# Patient Record
Sex: Male | Born: 1990 | Race: Black or African American | Hispanic: No | Marital: Single | Smoking: Never smoker
Health system: Southern US, Community
[De-identification: ages and names within clinical notes are randomized; demographics above are authoritative.]

## PROBLEM LIST (undated history)

## (undated) DIAGNOSIS — Q431 Hirschsprung's disease: Secondary | ICD-10-CM

---
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
## 2017-03-19 ENCOUNTER — Encounter: Payer: Self-pay | Admitting: *Deleted

## 2017-03-19 DIAGNOSIS — R1084 Generalized abdominal pain: Secondary | ICD-10-CM | POA: Insufficient documentation

## 2017-03-19 DIAGNOSIS — A749 Chlamydial infection, unspecified: Secondary | ICD-10-CM | POA: Insufficient documentation

## 2017-03-19 DIAGNOSIS — N390 Urinary tract infection, site not specified: Secondary | ICD-10-CM | POA: Insufficient documentation

## 2017-03-19 LAB — URINALYSIS, COMPLETE (UACMP) WITH MICROSCOPIC
BACTERIA UA: NONE SEEN
BILIRUBIN URINE: NEGATIVE
Glucose, UA: NEGATIVE mg/dL
HGB URINE DIPSTICK: NEGATIVE
Ketones, ur: NEGATIVE mg/dL
NITRITE: NEGATIVE
PROTEIN: 30 mg/dL — AB
Specific Gravity, Urine: 1.031 — ABNORMAL HIGH (ref 1.005–1.030)
pH: 5 (ref 5.0–8.0)

## 2017-03-19 LAB — CBC
HEMATOCRIT: 49.1 % (ref 40.0–52.0)
HEMOGLOBIN: 16.6 g/dL (ref 13.0–18.0)
MCH: 29.8 pg (ref 26.0–34.0)
MCHC: 33.9 g/dL (ref 32.0–36.0)
MCV: 87.8 fL (ref 80.0–100.0)
Platelets: 210 10*3/uL (ref 150–440)
RBC: 5.59 MIL/uL (ref 4.40–5.90)
RDW: 13.9 % (ref 11.5–14.5)
WBC: 5.3 10*3/uL (ref 3.8–10.6)

## 2017-03-19 LAB — COMPREHENSIVE METABOLIC PANEL
ALBUMIN: 4 g/dL (ref 3.5–5.0)
ALT: 25 U/L (ref 17–63)
ANION GAP: 9 (ref 5–15)
AST: 23 U/L (ref 15–41)
Alkaline Phosphatase: 53 U/L (ref 38–126)
BILIRUBIN TOTAL: 0.7 mg/dL (ref 0.3–1.2)
BUN: 14 mg/dL (ref 6–20)
CHLORIDE: 102 mmol/L (ref 101–111)
CO2: 28 mmol/L (ref 22–32)
Calcium: 9.2 mg/dL (ref 8.9–10.3)
Creatinine, Ser: 1.18 mg/dL (ref 0.61–1.24)
GFR calc Af Amer: >60 mL/min (ref >60)
GFR calc non Af Amer: >60 mL/min (ref >60)
GLUCOSE: 92 mg/dL (ref 65–99)
POTASSIUM: 3.4 mmol/L — AB (ref 3.5–5.1)
Sodium: 139 mmol/L (ref 135–145)
TOTAL PROTEIN: 7.6 g/dL (ref 6.5–8.1)

## 2017-03-19 LAB — LIPASE, BLOOD: LIPASE: 29 U/L (ref 11–51)

## 2017-03-19 LAB — TROPONIN I

## 2017-03-19 MED ORDER — OXYCODONE-ACETAMINOPHEN 5-325 MG PO TABS
1.0000 | ORAL_TABLET | Freq: Once | ORAL | Status: AC
  Administered 2017-03-19: 1 via ORAL
  Filled 2017-03-19: qty 1

## 2017-03-19 NOTE — ED Triage Notes (Signed)
Pt c/o generalized abdominal pain since Wednesday. Pt c/o decreased appetite and decreased PO intake. Pt has ileostomy that he says is working appropriately but has decreased output. Pt c/o nausea, denies vomiting. Pt takes no meds except a monthly B-12 shot.

## 2017-03-20 ENCOUNTER — Emergency Department: Payer: Medicaid Other

## 2017-03-20 ENCOUNTER — Emergency Department
Admission: EM | Admit: 2017-03-20 | Discharge: 2017-03-20 | Disposition: A | Discharge status: Home / Self Care | Payer: Medicaid Other | Attending: Emergency Medicine | Admitting: Emergency Medicine

## 2017-03-20 DIAGNOSIS — A64 Unspecified sexually transmitted disease: Secondary | ICD-10-CM

## 2017-03-20 DIAGNOSIS — N390 Urinary tract infection, site not specified: Secondary | ICD-10-CM

## 2017-03-20 DIAGNOSIS — R1084 Generalized abdominal pain: Secondary | ICD-10-CM

## 2017-03-20 DIAGNOSIS — A749 Chlamydial infection, unspecified: Secondary | ICD-10-CM

## 2017-03-20 HISTORY — DX: Hirschsprung's disease: Q43.1

## 2017-03-20 LAB — CHLAMYDIA/NGC RT PCR (ARMC ONLY)
CHLAMYDIA TR: DETECTED — AB
N GONORRHOEAE: NOT DETECTED

## 2017-03-20 MED ORDER — IOPAMIDOL (ISOVUE-300) INJECTION 61%
100.0000 mL | Freq: Once | INTRAVENOUS | Status: AC | PRN
  Administered 2017-03-20: 100 mL via INTRAVENOUS

## 2017-03-20 MED ORDER — MORPHINE SULFATE (PF) 2 MG/ML IV SOLN
INTRAVENOUS | Status: AC
  Administered 2017-03-20: 4 mg via INTRAVENOUS
  Filled 2017-03-20: qty 2

## 2017-03-20 MED ORDER — METOCLOPRAMIDE HCL 10 MG PO TABS: 10.0000 mg | ORAL_TABLET | Freq: Four times a day (QID) | ORAL | 0 refills | Status: DC

## 2017-03-20 MED ORDER — AZITHROMYCIN 1 G PO PACK
1.0000 g | PACK | Freq: Once | ORAL | Status: AC
  Administered 2017-03-20: 1 g via ORAL
  Filled 2017-03-20: qty 1

## 2017-03-20 MED ORDER — CEFTRIAXONE SODIUM IN DEXTROSE 20 MG/ML IV SOLN
1.0000 g | Freq: Once | INTRAVENOUS | Status: AC
Start: 2017-03-20 — End: 2017-03-20
  Administered 2017-03-20: 1 g via INTRAVENOUS
  Filled 2017-03-20: qty 50

## 2017-03-20 MED ORDER — MORPHINE SULFATE (PF) 4 MG/ML IV SOLN: 4.0000 mg | Freq: Once | INTRAVENOUS | Status: DC

## 2017-03-20 MED ORDER — IOPAMIDOL (ISOVUE-300) INJECTION 61%
30.0000 mL | Freq: Once | INTRAVENOUS | Status: AC | PRN
  Administered 2017-03-20: 30 mL via ORAL

## 2017-03-20 MED ORDER — DICYCLOMINE HCL 20 MG PO TABS: 20.0000 mg | ORAL_TABLET | Freq: Four times a day (QID) | ORAL | 0 refills | Status: DC | PRN

## 2017-03-20 MED ORDER — DOXYCYCLINE HYCLATE 50 MG PO CAPS: 100.0000 mg | ORAL_CAPSULE | Freq: Two times a day (BID) | ORAL | 0 refills | Status: DC

## 2017-03-20 MED ORDER — ONDANSETRON HCL 4 MG/2ML IJ SOLN
4.0000 mg | Freq: Once | INTRAMUSCULAR | Status: AC
  Administered 2017-03-20: 4 mg via INTRAVENOUS
  Filled 2017-03-20: qty 2

## 2017-03-20 MED ORDER — SODIUM CHLORIDE 0.9 % IV BOLUS (SEPSIS)
500.0000 mL | Freq: Once | INTRAVENOUS | Status: AC
  Administered 2017-03-20: 500 mL via INTRAVENOUS

## 2017-03-20 NOTE — ED Notes (Signed)
Pt not able to sign e-signature pad at this time. Computer restarted in room.

## 2017-03-20 NOTE — ED Notes (Signed)
IV attempt x1 by this RN and Nadeen Landau unsuccessful. WIll continue to follow up to gain access.

## 2017-03-20 NOTE — Discharge Instructions (Signed)
1. Take antibiotic as prescribed (doxycycline 100 mg twice daily 7 days). 2. You must notify all sexual partners to seek treatment at the health Department. 3. You may take medicines as needed for abdominal pain and nausea (Bentyl/Reglan). 4. Return to the ER for worsening symptoms, persistent vomiting, difficulty breathing or other concerns.

## 2017-03-20 NOTE — ED Notes (Signed)
Pt notified a new urine sample is needed, pt given urinal and placed at bedside. Pt states he does not need to urinate at this time. Pt verbalizes understanding of urine being needed for specific test.

## 2017-03-20 NOTE — ED Provider Notes (Signed)
[facility] Emergency Department Provider Note   ____________________________________________   First MD Initiated Contact with Patient 03/20/17 0143     (approximate)  I have reviewed the triage vital signs and the nursing notes.   HISTORY  Chief Complaint Abdominal Pain    HPI Kerry Ball is a 26 y.o. male brought to the ED from home via EMS with a chief complaint of abdominal pain. 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 Complains of generalized abdominal pain 2 days. Symptoms associated with decreased appetite and decreased output from his ostomy bag. Denies fever, chills, chest pain, shortness of breath, nausea, vomiting. Does note increased urinary frequency. Here visiting from [place].   Past Medical History:  Diagnosis Date  . Hirschsprung disease of rectosigmoid region     There are no active problems to display for this patient.   Past Surgical History:  Procedure Laterality Date  . ILEOSTOMY  1992    Prior to Admission medications   Medication Sig Start Date End Date Taking? Authorizing Provider  cyanocobalamin (,VITAMIN B-12,) 1000 MCG/ML injection Inject 1,000 mcg into the muscle once.   Yes [provider]  dicyclomine (BENTYL) 20 MG tablet Take 1 tablet (20 mg total) by mouth every 6 (six) hours as needed. 03/20/17   Irean Hong, MD  doxycycline (VIBRAMYCIN) 50 MG capsule Take 2 capsules (100 mg total) by mouth 2 (two) times daily. 03/20/17   Irean Hong, MD  metoCLOPramide (REGLAN) 10 MG tablet Take 1 tablet (10 mg total) by mouth 4 (four) times daily. 03/20/17   Irean Hong, MD    Allergies Patient has no known allergies.  History reviewed. No pertinent family history.  Social History Social History  Substance Use Topics  . Smoking status: Never Smoker  . Smokeless tobacco: Never Used  . Alcohol use No    Review of Systems  Constitutional: No  fever/chills. Eyes: No visual changes. ENT: No sore throat. Cardiovascular: Denies chest pain. Respiratory: Denies shortness of breath. Gastrointestinal: positive for abdominal pain.  No nausea, no vomiting.  No diarrhea.  No constipation. Genitourinary: positive for frequency. Negative for dysuria. Musculoskeletal: Negative for back pain. Skin: Negative for rash. Neurological: Negative for headaches, focal weakness or numbness.   ____________________________________________   PHYSICAL EXAM:  VITAL SIGNS: ED Triage Vitals  Enc Vitals Group     BP 03/19/17 2026 120/81     Pulse Rate 03/19/17 2026 65     Resp 03/19/17 2026 (!) 22     Temp 03/19/17 2026 99 F (37.2 C)     Temp Source 03/19/17 2026 Oral     SpO2 03/19/17 2026 98 %     Weight 03/19/17 2027 135 lb (61.2 kg)     Height 03/19/17 2027  (1.676 m)     Head Circumference --      Peak Flow --      Pain Score 03/19/17 2026 8     Pain Loc --      Pain Edu? --      Excl. in GC? --     Constitutional: Alert and oriented. Well appearing and in mild acute distress. Eyes: Conjunctivae are normal. PERRL. EOMI. Head: Atraumatic. Nose: No congestion/rhinnorhea. Mouth/Throat: Mucous membranes are moist.  Oropharynx non-erythematous. Neck: No stridor.   Cardiovascular: Normal rate, regular rhythm. Grossly normal heart sounds.  Good peripheral circulation. Respiratory: Normal respiratory effort.  No retractions. Lungs CTAB. Gastrointestinal: Soft  and mildly tender to palpation diffusely without rebound or guarding. Ileostomy with small amount soft, nonbloody stool. No distention. No abdominal bruits. No CVA tenderness. Musculoskeletal: No lower extremity tenderness nor edema.  No joint effusions. Neurologic:  Normal speech and language. No gross focal neurologic deficits are appreciated.  Skin:  Skin is warm, dry and intact. No rash noted. Psychiatric: Mood and affect are normal. Speech and behavior are  normal.  ____________________________________________   LABS (all labs ordered are listed, but only abnormal results are displayed)  Labs Reviewed  CHLAMYDIA/NGC RT PCR ([facility] ONLY) - Abnormal; Notable for the following:       Result Value   Chlamydia Tr DETECTED (*)    All other components within normal limits  COMPREHENSIVE METABOLIC PANEL - Abnormal; Notable for the following:    Potassium 3.4 (*)    All other components within normal limits  URINALYSIS, COMPLETE (UACMP) WITH MICROSCOPIC - Abnormal; Notable for the following:    Color, Urine YELLOW (*)    APPearance CLEAR (*)    Specific Gravity, Urine 1.031 (*)    Protein, ur 30 (*)    Leukocytes, UA SMALL (*)    Squamous Epithelial / LPF 0-5 (*)    All other components within normal limits  URINE CULTURE  LIPASE, BLOOD  CBC  TROPONIN I   ____________________________________________  EKG  ED ECG REPORT I, Dwan Hemmelgarn J, the attending physician, personally viewed and interpreted this ECG.   Date: 03/20/2017  EKG Time: 2036  Rate: 63  Rhythm: normal EKG, normal sinus rhythm  Axis: normal  Intervals:none  ST&T Change: nonspecific  ____________________________________________  RADIOLOGY  Ct Abdomen Pelvis W Contrast  Result Date: 03/20/2017 CLINICAL DATA:  Generalized abdominal pain. Ileostomy with decreased output. EXAM: CT ABDOMEN AND PELVIS WITH CONTRAST TECHNIQUE: Multidetector CT imaging of the abdomen and pelvis was performed using the standard protocol following bolus administration of intravenous contrast. CONTRAST:  ISOVUE-300 IOPAMIDOL (ISOVUE-300) INJECTION 61% COMPARISON:  None. FINDINGS: Lower chest: The lung bases are clear. Hepatobiliary: No focal liver abnormality is seen. No gallstones, gallbladder wall thickening, or biliary dilatation. Pancreas: No ductal dilatation or inflammation. Spleen: Normal in size without focal abnormality. Adrenals/Urinary Tract: Adrenal glands are  unremarkable. Kidneys are normal, without renal calculi, focal lesion, or hydronephrosis. Bladder is anteriorly located in the pelvis without wall thickening or inflammatory change. Stomach/Bowel: There is enteric contrast in the stomach and possibly duodenum. No contrast within the remainder of small bowel. Mild gastric wall thickening about the greater curvature. Right lower quadrant ileostomy without parastomal hernia. There is fecalization of small bowel contents proximal to the ileostomy without small bowel inflammation. Small bowel is otherwise decompressed. Stapled off descending and sigmoid colon contain high-density contrast which is likely chronic. There is no evidence of bowel wall thickening or inflammatory change. Vascular/Lymphatic: No significant vascular findings are present. No enlarged abdominal or pelvic lymph nodes. Reproductive: Prostate is unremarkable. Other: No free air or ascites. No mesenteric edema. No intra-abdominal abscess. Musculoskeletal: There are no acute or suspicious osseous abnormalities. IMPRESSION: 1. Right lower quadrant ileostomy with fecalization of small bowel contents proximal to the ileostomy, suggesting slow transit. No associated bowel inflammation or evidence of obstruction. 2. Mild gastric wall thickening of the greater curvature, gastritis versus peptic ulcer disease. Electronically Signed   By: Rubye Oaks M.D.   On: 03/20/2017 04:36    ____________________________________________   PROCEDURES  Procedure(s) performed: None  Procedures  Critical Care performed: No  ____________________________________________   INITIAL  IMPRESSION / ASSESSMENT AND PLAN / ED COURSE  Pertinent labs & imaging results that were available during my care of the patient were reviewed by me and considered in my medical decision making (see chart for details).  26 year old male with ileostomy who presents with generalized abdominal pain. Differential diagnosis  includes, but is not limited to, acute appendicitis, renal colic, testicular torsion, urinary tract infection/pyelonephritis, prostatitis,  epididymitis, diverticulitis, small bowel obstruction or ileus, colitis, abdominal aortic aneurysm, gastroenteritis, hernia, etc. laboratory results unremarkable. Urinalysis demonstrates UTI. Patient is sexually active having unprotected intercourse and would like testing for STD. Will proceed with CT abdomen/pelvis to evaluate for intra-abdominal etiology of patient's pain.  Clinical Course as of Mar 20 710  Sat Mar 20, 2017  1610 Patient completed one bottle of oral contrast; states he cannot tolerate anymore. Will send for CT scan.  [JS]  0708 Patient was soundly asleep when I updated him of all testing results including CT scan. He was treated with azithromycin for chlamydia and discharged home on doxycycline. Strict return precautions were given. Patient verbalize understanding and agreed with plan of care.  [JS]    Clinical Course User Index [JS] Irean Hong, MD     ____________________________________________   FINAL CLINICAL IMPRESSION(S) / ED DIAGNOSES  Final diagnoses:  Generalized abdominal pain  Chlamydia  Lower urinary tract infectious disease  STD (male)      NEW MEDICATIONS STARTED DURING THIS VISIT:  New Prescriptions   DICYCLOMINE (BENTYL) 20 MG TABLET    Take 1 tablet (20 mg total) by mouth every 6 (six) hours as needed.   DOXYCYCLINE (VIBRAMYCIN) 50 MG CAPSULE    Take 2 capsules (100 mg total) by mouth 2 (two) times daily.   METOCLOPRAMIDE (REGLAN) 10 MG TABLET    Take 1 tablet (10 mg total) by mouth 4 (four) times daily.     Note:  This document was prepared using Dragon voice recognition software and may include unintentional dictation errors.    Irean Hong, MD 03/20/17 [phone]

## 2017-03-20 NOTE — ED Notes (Signed)
Pt informed that [facility] will be sending ileostomy bags for him to change his current bag. [facility] have been called to verify pt's ability to be DC to same place. Pt waiting on urine results in order to be DC at this time.

## 2017-03-21 ENCOUNTER — Emergency Department: Payer: Medicaid Other

## 2017-03-21 ENCOUNTER — Emergency Department
Admission: EM | Admit: 2017-03-21 | Discharge: 2017-03-22 | Disposition: A | Discharge status: Home / Self Care | Payer: Medicaid Other | Attending: Emergency Medicine | Admitting: Emergency Medicine

## 2017-03-21 DIAGNOSIS — Q431 Hirschsprung's disease: Secondary | ICD-10-CM | POA: Insufficient documentation

## 2017-03-21 DIAGNOSIS — R1031 Right lower quadrant pain: Secondary | ICD-10-CM

## 2017-03-21 DIAGNOSIS — Z79899 Other long term (current) drug therapy: Secondary | ICD-10-CM | POA: Diagnosis not present

## 2017-03-21 DIAGNOSIS — Z932 Ileostomy status: Secondary | ICD-10-CM | POA: Diagnosis not present

## 2017-03-21 DIAGNOSIS — K2970 Gastritis, unspecified, without bleeding: Secondary | ICD-10-CM

## 2017-03-21 DIAGNOSIS — R109 Unspecified abdominal pain: Secondary | ICD-10-CM

## 2017-03-21 LAB — LIPASE, BLOOD: LIPASE: 37 U/L (ref 11–51)

## 2017-03-21 LAB — COMPREHENSIVE METABOLIC PANEL
ALBUMIN: 4.3 g/dL (ref 3.5–5.0)
ALT: 23 U/L (ref 17–63)
AST: 23 U/L (ref 15–41)
Alkaline Phosphatase: 52 U/L (ref 38–126)
Anion gap: 7 (ref 5–15)
BUN: 14 mg/dL (ref 6–20)
CHLORIDE: 106 mmol/L (ref 101–111)
CO2: 28 mmol/L (ref 22–32)
CREATININE: 1.13 mg/dL (ref 0.61–1.24)
Calcium: 9.3 mg/dL (ref 8.9–10.3)
GFR calc Af Amer: >60 mL/min (ref >60)
GFR calc non Af Amer: >60 mL/min (ref >60)
GLUCOSE: 85 mg/dL (ref 65–99)
POTASSIUM: 3.6 mmol/L (ref 3.5–5.1)
Sodium: 141 mmol/L (ref 135–145)
Total Bilirubin: 0.9 mg/dL (ref 0.3–1.2)
Total Protein: 7.8 g/dL (ref 6.5–8.1)

## 2017-03-21 LAB — CBC
HEMATOCRIT: 49.7 % (ref 40.0–52.0)
Hemoglobin: 16.4 g/dL (ref 13.0–18.0)
MCH: 29 pg (ref 26.0–34.0)
MCHC: 33 g/dL (ref 32.0–36.0)
MCV: 87.9 fL (ref 80.0–100.0)
Platelets: 208 10*3/uL (ref 150–440)
RBC: 5.65 MIL/uL (ref 4.40–5.90)
RDW: 13.9 % (ref 11.5–14.5)
WBC: 4.6 10*3/uL (ref 3.8–10.6)

## 2017-03-21 LAB — URINE CULTURE

## 2017-03-21 MED ORDER — SODIUM CHLORIDE 0.9 % IV BOLUS (SEPSIS)
1000.0000 mL | Freq: Once | INTRAVENOUS | Status: AC
  Administered 2017-03-21: 1000 mL via INTRAVENOUS

## 2017-03-21 MED ORDER — IOPAMIDOL (ISOVUE-300) INJECTION 61%
30.0000 mL | Freq: Once | INTRAVENOUS | Status: AC
  Administered 2017-03-21: 30 mL via ORAL
  Filled 2017-03-21: qty 30

## 2017-03-21 MED ORDER — MORPHINE SULFATE (PF) 4 MG/ML IV SOLN
4.0000 mg | Freq: Once | INTRAVENOUS | Status: AC
  Administered 2017-03-21: 4 mg via INTRAVENOUS
  Filled 2017-03-21: qty 1

## 2017-03-21 MED ORDER — ONDANSETRON HCL 4 MG/2ML IJ SOLN
4.0000 mg | Freq: Once | INTRAMUSCULAR | Status: AC
  Administered 2017-03-21: 4 mg via INTRAVENOUS
  Filled 2017-03-21: qty 2

## 2017-03-21 NOTE — ED Notes (Signed)
Lab at bedside

## 2017-03-21 NOTE — ED Notes (Signed)
This EDT attempted to stick pt and pt jerked back and st that he had a sharp stinging sensation radiate thru his entire arm. The needle did not fully advance into pts arm. Pt st he believes I hit a nerve. This EDT used a 23gauge. Pt st "they had to use Ultrasound the other day to get my vein". This EDT informed the other available RN's in triage and this EDT called lab to attempt to obtain blood

## 2017-03-21 NOTE — ED Provider Notes (Signed)
[facility] Emergency Department Provider Note  ____________________________________________   First MD Initiated Contact with Patient 03/21/17 2042     (approximate)  I have reviewed the triage vital signs and the nursing notes.   HISTORY  Chief Complaint Abdominal Pain   HPI Kerry Ball is a 26 y.o. male with a history of an ileostomy from Hirschsprung's disease as an infant who is presenting to the emergency department today with worsening pain to his right lower quadrant. He says the pain isn't worsening since being discharged from the emergency department yesterday morning. He says that he is also in need of replacement ostomy bags. He denies any nausea or vomiting. Reports brown stool production from ostomy. He says the pain is an 8-9 or 10 at this time and feels like a tightness and cramping. No radiation to the back.   Past Medical History:  Diagnosis Date  . Hirschsprung disease of rectosigmoid region     There are no active problems to display for this patient.   Past Surgical History:  Procedure Laterality Date  . ILEOSTOMY  1992    Prior to Admission medications   Medication Sig Start Date End Date Taking? Authorizing Provider  cyanocobalamin (,VITAMIN B-12,) 1000 MCG/ML injection Inject 1,000 mcg into the muscle once.    [provider]  dicyclomine (BENTYL) 20 MG tablet Take 1 tablet (20 mg total) by mouth every 6 (six) hours as needed. 03/20/17   Irean Hong, MD  doxycycline (VIBRAMYCIN) 50 MG capsule Take 2 capsules (100 mg total) by mouth 2 (two) times daily. 03/20/17   Irean Hong, MD  metoCLOPramide (REGLAN) 10 MG tablet Take 1 tablet (10 mg total) by mouth 4 (four) times daily. 03/20/17   Irean Hong, MD    Allergies Patient has no known allergies.  No family history on file.  Social History Social History  Substance Use Topics  . Smoking status: Never Smoker  . Smokeless tobacco: Never Used  . Alcohol use No      Review of Systems  Constitutional: No fever/chills Eyes: No visual changes. ENT: No sore throat. Cardiovascular: Denies chest pain. Respiratory: Denies shortness of breath. Gastrointestinal:   No nausea, no vomiting.  No diarrhea.  No constipation. Genitourinary: Negative for dysuria. Musculoskeletal: Negative for back pain. Skin: Negative for rash. Neurological: Negative for headaches, focal weakness or numbness.   ____________________________________________   PHYSICAL EXAM:  VITAL SIGNS: ED Triage Vitals  Enc Vitals Group     BP 03/21/17 2022 112/66     Pulse Rate 03/21/17 2022 74     Resp 03/21/17 2022 16     Temp 03/21/17 2022 98.4 F (36.9 C)     Temp Source 03/21/17 2022 Oral     SpO2 03/21/17 2022 99 %     Weight 03/21/17 2023 135 lb (61.2 kg)     Height 03/21/17 2023  (1.676 m)     Head Circumference --      Peak Flow --      Pain Score 03/21/17 2022 7     Pain Loc --      Pain Edu? --      Excl. in GC? --     Constitutional: Alert and oriented. Well appearing and in no acute distress. Eyes: Conjunctivae are normal.  Head: Atraumatic. Nose: No congestion/rhinnorhea. Mouth/Throat: Mucous membranes are moist.  Neck: No stridor.   Cardiovascular: Normal rate, regular rhythm. Grossly normal heart sounds.   Respiratory: Normal  respiratory effort.  No retractions. Lungs CTAB. Gastrointestinal: Soft with moderate tenderness to palpation without guarding to the right lower quadrant. The ileostomy is visualized and has healthy-appearing pink mucosal tissue. There is peristalsis visualized with a small amount of gas as well as brown soft stool production. No distention. No CVA tenderness. Musculoskeletal: No lower extremity tenderness nor edema.  No joint effusions. Neurologic:  Normal speech and language. No gross focal neurologic deficits are appreciated. Skin:  Skin is warm, dry and intact. No rash noted. Psychiatric: Mood and affect are normal. Speech  and behavior are normal.  ____________________________________________   LABS (all labs ordered are listed, but only abnormal results are displayed)  Labs Reviewed  LIPASE, BLOOD  COMPREHENSIVE METABOLIC PANEL  CBC  URINALYSIS, COMPLETE (UACMP) WITH MICROSCOPIC   ____________________________________________  EKG   ____________________________________________  RADIOLOGY  patient with abdominal x-ray concerning for worsening distention of the small bowel. ____________________________________________   PROCEDURES  Procedure(s) performed:   Procedures  Critical Care performed:   ____________________________________________   INITIAL IMPRESSION / ASSESSMENT AND PLAN / ED COURSE  Pertinent labs & imaging results that were available during my care of the patient were reviewed by me and considered in my medical decision making (see chart for details).  DDX: Chronic pain, intestinal obstruction, abscess, hepatitis, pancreatitis    patient had a CT scan of the abdomen and pelvis within the last 48 hours which did not show acute process. However, he was diagnosed with chlamydia and was discharged with doxycycline. ----------------------------------------- 10:48 PM on 03/21/2017 -----------------------------------------  Patient at this time without any distress. Received morphine after the results of his x-ray. Patient pending CT scan. Time of the Dr. York Cerise.  ____________________________________________   FINAL CLINICAL IMPRESSION(S) / ED DIAGNOSES  Final diagnoses:  Abdominal pain      NEW MEDICATIONS STARTED DURING THIS VISIT:  New Prescriptions   No medications on file     Note:  This document was prepared using Dragon voice recognition software and may include unintentional dictation errors.     Myrna Blazer, MD 03/21/17 [phone]

## 2017-03-21 NOTE — ED Notes (Signed)
Pt reports colostomy since 1992 d/t Hirschsprung's disease, pt reports still having bilateral abdominal pain since dc on last  Saturday morning, pt requesting ostomy supplies and pain medicine

## 2017-03-21 NOTE — Discharge Instructions (Addendum)

## 2017-03-21 NOTE — ED Triage Notes (Signed)
Pt states has "all over" abd pain since Wednesday. Pt states he was here this week for same, "but I don't feel no better". Pt denies fever, vomiting, constipation, nausea.

## 2017-03-21 NOTE — ED Provider Notes (Signed)
Ct Abdomen Pelvis W Contrast  Result Date: 03/22/2017 CLINICAL DATA:  Pt states has "all over" abd pain since Wednesday. Pt states he was here this week for same, "but I don't feel no better". Pt denies fever, vomiting, constipation, nausea. Ileostomy. EXAM: CT ABDOMEN AND PELVIS WITH CONTRAST TECHNIQUE: Multidetector CT imaging of the abdomen and pelvis was performed using the standard protocol following bolus administration of intravenous contrast. CONTRAST:  ISOVUE-300 IOPAMIDOL (ISOVUE-300) INJECTION 61% COMPARISON:  03/20/2017 FINDINGS: Lower chest: The lung bases are clear. Hepatobiliary: No focal liver abnormality is seen. No gallstones, gallbladder wall thickening, or biliary dilatation. Pancreas: Unremarkable. No pancreatic ductal dilatation or surrounding inflammatory changes. Spleen: Normal in size without focal abnormality. Adrenals/Urinary Tract: Adrenal glands are unremarkable. Kidneys are normal, without renal calculi, focal lesion, or hydronephrosis. Bladder is unremarkable. Stomach/Bowel: Asymmetrical thickening of the gastric wall along the inferior body of the stomach. Surgical clips are present in this area. This may be postoperative or indicate inflammatory process. Partial right hemicolectomy with right lower quadrant ileostomy. Stool and contrast material demonstrated in the rectosigmoid colon. Descending colon stump. Mild dilatation and fecalization of contents in the terminal ileum it is similar to previous study and likely reflects postoperative stasis. No evidence of obstruction. Vascular/Lymphatic: No significant vascular findings are present. No enlarged abdominal or pelvic lymph nodes. Reproductive: Prostate is unremarkable. Other: No free air or free fluid in the abdomen. Musculoskeletal: No acute or significant osseous findings. IMPRESSION: 1. No significant changes since prior study. 2. Right lower quadrant ileostomy. Mildly dilated terminal ileum with fecalization of  material likely representing chronic stasis. 3. Asymmetrical thickening of the gastric wall along the inferior body of the stomach may be postoperative or inflammatory. 4. No evidence to suggest bowel obstruction. Electronically Signed   By: Burman Nieves M.D.   On: 03/22/2017 02:51   Dg Abd Acute W/chest  Result Date: 03/21/2017 CLINICAL DATA:  Abdominal pain.  Colostomy. EXAM: DG ABDOMEN ACUTE W/ 1V CHEST COMPARISON:  03/20/2017 FINDINGS: There has been significant increase in small bowel dilatation with fecalization of small bowel contents. Cannot rule out small bowel obstruction of the level of the ileostomy. Heart size and mediastinal contours are within normal limits. Both lungs are clear. IMPRESSION: 1. Progressive distension of small bowel loops containing a fecalized contents. Consider further evaluation with CT of the abdomen and pelvis with oral and IV contrast material. Electronically Signed   By: Signa Kell M.D.   On: 03/21/2017 21:44   Clinical Course as of Mar 22 612  Wynelle Link Mar 21, 2017  2300 Assuming care from Dr. Pershing Proud.  In short, Kerry Ball is a 26 y.o. male with a chief complaint of abdominal pain and distention.  Refer to the original H&P for additional details.  The current plan of care is to  .   [CF]  Mon Mar 22, 2017  1610 CT scan without any obstruction or other acute process other than some inflammation in the stomach.he has no leukocytosis and although the rest of his labs are reassuring.  I will give him prescriptions for some Carafate to help soothe his stomach and recommended that he follows up as an outpatient.  I will give him the name and number for GI with whom he can follow-up if he stays in this area although he is currently visiting from [place].  He has been resting comfortably.  I also explained that narcotics will not be good for him because they may potentially slow down  his bowels even more and could lead to an ileus or obstruction.  He understands  and agrees with the plan. CT Abdomen Pelvis W Contrast [CF]    Clinical Course User Index [CF] Loleta Rose, MD     Final diagnoses:  Abdominal pain  Right lower quadrant abdominal pain  Gastritis, presence of bleeding unspecified, unspecified chronicity, unspecified gastritis type      Loleta Rose, MD 03/22/17 [phone]

## 2017-03-21 NOTE — ED Notes (Signed)
Pt arrived via EMS and was brought to stat registration via wheelchair; pt was seen here Friday night/saturday morning for abd pain; upon discharge pt was provided with supplies for ileostomy; pt returns tonight needing more supplies;

## 2017-03-22 ENCOUNTER — Emergency Department: Payer: Medicaid Other

## 2017-03-22 ENCOUNTER — Encounter: Payer: Self-pay | Admitting: Radiology

## 2017-03-22 MED ORDER — OMEPRAZOLE MAGNESIUM 20 MG PO TBEC: 20.0000 mg | DELAYED_RELEASE_TABLET | Freq: Every day | ORAL | 1 refills | Status: DC

## 2017-03-22 MED ORDER — MORPHINE SULFATE (PF) 4 MG/ML IV SOLN
4.0000 mg | Freq: Once | INTRAVENOUS | Status: AC
  Administered 2017-03-22: 4 mg via INTRAVENOUS
  Filled 2017-03-22: qty 1

## 2017-03-22 MED ORDER — IOPAMIDOL (ISOVUE-300) INJECTION 61%
100.0000 mL | Freq: Once | INTRAVENOUS | Status: AC | PRN
  Administered 2017-03-22: 100 mL via INTRAVENOUS

## 2017-03-22 MED ORDER — SUCRALFATE 1 G PO TABS: 1.0000 g | ORAL_TABLET | Freq: Four times a day (QID) | ORAL | 1 refills | Status: DC | PRN

## 2017-03-22 NOTE — ED Notes (Signed)
Pt notified he needed to drink his contrast so CT could be performed and to see what is causing his pain. Pt verbalized understanding of this. EDP notified of pt's pain, see MAR for follow up.

## 2017-03-22 NOTE — ED Notes (Signed)
Pt texting and is in NAD at this time, pt notified to continue to work on contrast. Pt states "I'm trying." Denies nausea.

## 2017-03-22 NOTE — ED Notes (Signed)
Patient transported to CT 

## 2017-03-22 NOTE — ED Notes (Addendum)
Pt lying in bed, looking at phone, friend at bedside. Pt in NAD at this time.

## 2017-03-22 NOTE — ED Notes (Signed)
Pt unable to sign DC pad.

## 2017-03-25 ENCOUNTER — Emergency Department
Admission: EM | Admit: 2017-03-25 | Discharge: 2017-03-25 | Disposition: A | Discharge status: Home / Self Care | Payer: Medicaid Other | Attending: Emergency Medicine | Admitting: Emergency Medicine

## 2017-03-25 ENCOUNTER — Encounter: Payer: Self-pay | Admitting: Emergency Medicine

## 2017-03-25 DIAGNOSIS — R109 Unspecified abdominal pain: Secondary | ICD-10-CM | POA: Insufficient documentation

## 2017-03-25 DIAGNOSIS — Z5321 Procedure and treatment not carried out due to patient leaving prior to being seen by health care provider: Secondary | ICD-10-CM | POA: Insufficient documentation

## 2017-03-25 LAB — CBC
HCT: 48 % (ref 40.0–52.0)
Hemoglobin: 16.1 g/dL (ref 13.0–18.0)
MCH: 29.5 pg (ref 26.0–34.0)
MCHC: 33.7 g/dL (ref 32.0–36.0)
MCV: 87.5 fL (ref 80.0–100.0)
PLATELETS: 201 10*3/uL (ref 150–440)
RBC: 5.48 MIL/uL (ref 4.40–5.90)
RDW: 13.7 % (ref 11.5–14.5)
WBC: 4.2 10*3/uL (ref 3.8–10.6)

## 2017-03-25 LAB — COMPREHENSIVE METABOLIC PANEL
ALBUMIN: 3.9 g/dL (ref 3.5–5.0)
ALK PHOS: 49 U/L (ref 38–126)
ALT: 25 U/L (ref 17–63)
AST: 31 U/L (ref 15–41)
Anion gap: 8 (ref 5–15)
BUN: 12 mg/dL (ref 6–20)
CALCIUM: 9.3 mg/dL (ref 8.9–10.3)
CO2: 26 mmol/L (ref 22–32)
CREATININE: 1.08 mg/dL (ref 0.61–1.24)
Chloride: 106 mmol/L (ref 101–111)
GFR calc Af Amer: >60 mL/min (ref >60)
GFR calc non Af Amer: >60 mL/min (ref >60)
GLUCOSE: 106 mg/dL — AB (ref 65–99)
Potassium: 3.3 mmol/L — ABNORMAL LOW (ref 3.5–5.1)
SODIUM: 140 mmol/L (ref 135–145)
Total Bilirubin: 1.1 mg/dL (ref 0.3–1.2)
Total Protein: 7.5 g/dL (ref 6.5–8.1)

## 2017-03-25 LAB — URINALYSIS, COMPLETE (UACMP) WITH MICROSCOPIC
Bacteria, UA: NONE SEEN
Bilirubin Urine: NEGATIVE
GLUCOSE, UA: NEGATIVE mg/dL
HGB URINE DIPSTICK: NEGATIVE
Ketones, ur: NEGATIVE mg/dL
Leukocytes, UA: NEGATIVE
Nitrite: NEGATIVE
Protein, ur: 30 mg/dL — AB
RBC / HPF: NONE SEEN RBC/hpf (ref 0–5)
SPECIFIC GRAVITY, URINE: 1.028 (ref 1.005–1.030)
Squamous Epithelial / LPF: NONE SEEN
pH: 5 (ref 5.0–8.0)

## 2017-03-25 LAB — LIPASE, BLOOD: Lipase: 28 U/L (ref 11–51)

## 2017-03-25 NOTE — ED Triage Notes (Addendum)
Patient presents to ED via POV from home with c/o abdominal pain. Patient was recently seen here for the same with an unremarkable workup. Patient was told to follow up with his PCP. Patient states he has an appointment at the end of this month but is not able to tolerate the pain. Patient texting in triage.

## 2017-03-25 NOTE — ED Notes (Signed)
Pt called in ED lobby with no response 

## 2017-03-29 ENCOUNTER — Encounter: Payer: Self-pay | Admitting: Emergency Medicine

## 2017-03-29 ENCOUNTER — Emergency Department
Admission: EM | Admit: 2017-03-29 | Discharge: 2017-03-29 | Disposition: A | Discharge status: Home / Self Care | Payer: Medicaid Other | Attending: Emergency Medicine | Admitting: Emergency Medicine

## 2017-03-29 DIAGNOSIS — Z590 Homelessness: Secondary | ICD-10-CM | POA: Diagnosis not present

## 2017-03-29 DIAGNOSIS — K5900 Constipation, unspecified: Secondary | ICD-10-CM | POA: Insufficient documentation

## 2017-03-29 DIAGNOSIS — Z933 Colostomy status: Secondary | ICD-10-CM | POA: Diagnosis not present

## 2017-03-29 DIAGNOSIS — Q431 Hirschsprung's disease: Secondary | ICD-10-CM | POA: Insufficient documentation

## 2017-03-29 DIAGNOSIS — R1084 Generalized abdominal pain: Secondary | ICD-10-CM | POA: Diagnosis present

## 2017-03-29 LAB — URINALYSIS, COMPLETE (UACMP) WITH MICROSCOPIC
BACTERIA UA: NONE SEEN
Bilirubin Urine: NEGATIVE
Glucose, UA: NEGATIVE mg/dL
HGB URINE DIPSTICK: NEGATIVE
Ketones, ur: 5 mg/dL — AB
NITRITE: NEGATIVE
Protein, ur: 30 mg/dL — AB
SPECIFIC GRAVITY, URINE: 1.032 — AB (ref 1.005–1.030)
pH: 5 (ref 5.0–8.0)

## 2017-03-29 LAB — URINE DRUG SCREEN, QUALITATIVE (ARMC ONLY)
Amphetamines, Ur Screen: NOT DETECTED
Barbiturates, Ur Screen: NOT DETECTED
Benzodiazepine, Ur Scrn: NOT DETECTED
CANNABINOID 50 NG, UR ~~LOC~~: NOT DETECTED
COCAINE METABOLITE, UR ~~LOC~~: NOT DETECTED
MDMA (ECSTASY) UR SCREEN: NOT DETECTED
Methadone Scn, Ur: NOT DETECTED
Opiate, Ur Screen: NOT DETECTED
Phencyclidine (PCP) Ur S: NOT DETECTED
TRICYCLIC, UR SCREEN: NOT DETECTED

## 2017-03-29 LAB — COMPREHENSIVE METABOLIC PANEL
ALK PHOS: 59 U/L (ref 38–126)
ALT: 30 U/L (ref 17–63)
AST: 28 U/L (ref 15–41)
Albumin: 3.9 g/dL (ref 3.5–5.0)
Anion gap: 8 (ref 5–15)
BILIRUBIN TOTAL: 0.7 mg/dL (ref 0.3–1.2)
BUN: 12 mg/dL (ref 6–20)
CALCIUM: 9 mg/dL (ref 8.9–10.3)
CO2: 26 mmol/L (ref 22–32)
CREATININE: 0.94 mg/dL (ref 0.61–1.24)
Chloride: 103 mmol/L (ref 101–111)
GFR calc Af Amer: >60 mL/min (ref >60)
Glucose, Bld: 103 mg/dL — ABNORMAL HIGH (ref 65–99)
Potassium: 3.1 mmol/L — ABNORMAL LOW (ref 3.5–5.1)
Sodium: 137 mmol/L (ref 135–145)
TOTAL PROTEIN: 7.2 g/dL (ref 6.5–8.1)

## 2017-03-29 LAB — CBC WITH DIFFERENTIAL/PLATELET
Basophils Absolute: 0 10*3/uL (ref 0–0.1)
Basophils Relative: 1 %
EOS ABS: 0.1 10*3/uL (ref 0–0.7)
EOS PCT: 3 %
HCT: 47.2 % (ref 40.0–52.0)
HEMOGLOBIN: 16.1 g/dL (ref 13.0–18.0)
LYMPHS ABS: 2.2 10*3/uL (ref 1.0–3.6)
Lymphocytes Relative: 44 %
MCH: 30.1 pg (ref 26.0–34.0)
MCHC: 34.1 g/dL (ref 32.0–36.0)
MCV: 88.2 fL (ref 80.0–100.0)
MONOS PCT: 11 %
Monocytes Absolute: 0.5 10*3/uL (ref 0.2–1.0)
Neutro Abs: 2 10*3/uL (ref 1.4–6.5)
Neutrophils Relative %: 41 %
Platelets: 187 10*3/uL (ref 150–440)
RBC: 5.35 MIL/uL (ref 4.40–5.90)
RDW: 13.6 % (ref 11.5–14.5)
WBC: 4.9 10*3/uL (ref 3.8–10.6)

## 2017-03-29 MED ORDER — AZITHROMYCIN 500 MG PO TABS
1000.0000 mg | ORAL_TABLET | Freq: Once | ORAL | Status: AC
  Administered 2017-03-29: 1000 mg via ORAL
  Filled 2017-03-29: qty 2

## 2017-03-29 MED ORDER — METOCLOPRAMIDE HCL 10 MG PO TABS: 10.0000 mg | ORAL_TABLET | Freq: Four times a day (QID) | ORAL | 0 refills | Status: AC

## 2017-03-29 MED ORDER — ONDANSETRON HCL 4 MG/2ML IJ SOLN
4.0000 mg | Freq: Once | INTRAMUSCULAR | Status: AC
Start: 2017-03-29 — End: 2017-03-29
  Administered 2017-03-29: 4 mg via INTRAVENOUS
  Filled 2017-03-29: qty 2

## 2017-03-29 MED ORDER — SUCRALFATE 1 G PO TABS: 1.0000 g | ORAL_TABLET | Freq: Four times a day (QID) | ORAL | 1 refills | Status: AC | PRN

## 2017-03-29 MED ORDER — OMEPRAZOLE MAGNESIUM 20 MG PO TBEC: 20.0000 mg | DELAYED_RELEASE_TABLET | Freq: Every day | ORAL | 1 refills | Status: AC

## 2017-03-29 MED ORDER — DOXYCYCLINE HYCLATE 50 MG PO CAPS: 100.0000 mg | ORAL_CAPSULE | Freq: Two times a day (BID) | ORAL | 0 refills | Status: AC

## 2017-03-29 MED ORDER — OXYCODONE-ACETAMINOPHEN 5-325 MG PO TABS
1.0000 | ORAL_TABLET | Freq: Once | ORAL | Status: AC
  Administered 2017-03-29: 1 via ORAL
  Filled 2017-03-29 (×2): qty 1

## 2017-03-29 MED ORDER — FENTANYL CITRATE (PF) 100 MCG/2ML IJ SOLN
50.0000 ug | Freq: Once | INTRAMUSCULAR | Status: AC
  Administered 2017-03-29: 50 ug via INTRAVENOUS
  Filled 2017-03-29: qty 2

## 2017-03-29 MED ORDER — KETOROLAC TROMETHAMINE 30 MG/ML IJ SOLN
30.0000 mg | Freq: Once | INTRAMUSCULAR | Status: AC
  Administered 2017-03-29: 30 mg via INTRAVENOUS
  Filled 2017-03-29: qty 1

## 2017-03-29 MED ORDER — LACTULOSE 10 GM/15ML PO SOLN
30.0000 g | Freq: Once | ORAL | Status: AC
  Administered 2017-03-29: 30 g via ORAL
  Filled 2017-03-29: qty 60

## 2017-03-29 MED ORDER — CEFTRIAXONE SODIUM 250 MG IJ SOLR
250.0000 mg | INTRAMUSCULAR | Status: DC
  Administered 2017-03-29: 250 mg via INTRAMUSCULAR
  Filled 2017-03-29: qty 250

## 2017-03-29 MED ORDER — DICYCLOMINE HCL 20 MG PO TABS: 20.0000 mg | ORAL_TABLET | Freq: Four times a day (QID) | ORAL | 0 refills | Status: DC | PRN

## 2017-03-29 NOTE — ED Notes (Signed)
Resting quietly with eyes closed. No acute distress noted.

## 2017-03-29 NOTE — ED Provider Notes (Signed)
we will attempt to prescribe his medications again for him and he will be going to medication management. Return to give him a bus ticket and instruct him to go to [place]. He was given Rocephin and Zithromax here to cover for gonorrhea. Reportedly he received a phone call while in the ER instructing him that he has gonorrhea.   Emily Filbert, MD 03/29/17 1053

## 2017-03-29 NOTE — ED Notes (Signed)
Patient sound asleep when attempting to administer the requested pain medication. Notified MD who stated to hold medication until patient was awake.

## 2017-03-29 NOTE — ED Provider Notes (Signed)
[facility] Emergency Department Provider Note   ____________________________________________   First MD Initiated Contact with Patient 03/29/17 [phone]     (approximate)  I have reviewed the triage vital signs and the nursing notes.   HISTORY  Chief Complaint Abdominal Pain    HPI Kerry Ball is a 26 y.o. male brought to the ED by EMS from homeless shelter with a chief complaint of generalized abdominal pain. Patient has a history of Hirschsprung disease status post ileostomy. This is the patient's third ED visit in the past 2 weeks. Coincidentally, patient came from [place] 2 weeks ago via bus. Staying at the homeless shelter. Complains of generalized abdominal pain without associated nausea or vomiting. Having stool output in his ostomy bag but states it has been less over the last 2 weeks.Initial ED visits patient's CT scan was negative for SBO and he was treated for Chlamydia in the ED. He was prescribed doxycycline for UTI but did not get it filled. In fact, he was also prescribed Reglan,Bentyl, Prilosec and Carafate. Has filled none of his prescriptions. Complains of continued dysuria. on his second visit patient had a plain film x-ray which was concerning for worsening abdominal distention and subsequently had a repeat CT scan which is negative for SBO. Both CT scans demonstrated fecalization of small bowel contents proximal to the ileostomy, suggesting slow transit. patient denies fever, chills, chest pain, shortness of breath, nausea, vomiting, diarrhea.   Past Medical History:  Diagnosis Date  . Hirschsprung disease of rectosigmoid region     There are no active problems to display for this patient.   Past Surgical History:  Procedure Laterality Date  . ILEOSTOMY  1992    Prior to Admission medications   Medication Sig Start Date End Date Taking? Authorizing Provider  cyanocobalamin (,VITAMIN B-12,) 1000 MCG/ML injection Inject 1,000 mcg into  the muscle once.    [provider]  dicyclomine (BENTYL) 20 MG tablet Take 1 tablet (20 mg total) by mouth every 6 (six) hours as needed. 03/20/17   Irean Hong, MD  doxycycline (VIBRAMYCIN) 50 MG capsule Take 2 capsules (100 mg total) by mouth 2 (two) times daily. 03/20/17   Irean Hong, MD  metoCLOPramide (REGLAN) 10 MG tablet Take 1 tablet (10 mg total) by mouth 4 (four) times daily. 03/20/17   Irean Hong, MD  omeprazole (PRILOSEC OTC) 20 MG tablet Take 1 tablet (20 mg total) by mouth daily. 03/22/17 03/22/18  Loleta Rose, MD  sucralfate (CARAFATE) 1 g tablet Take 1 tablet (1 g total) by mouth 4 (four) times daily as needed (for abdominal discomfort, nausea, and/or vomiting). 03/22/17   Loleta Rose, MD    Allergies Patient has no known allergies.  No family history on file.  Social History Social History  Substance Use Topics  . Smoking status: Never Smoker  . Smokeless tobacco: Never Used  . Alcohol use No    Review of Systems  Constitutional: No fever/chills. Eyes: No visual changes. ENT: No sore throat. Cardiovascular: Denies chest pain. Respiratory: Denies shortness of breath. Gastrointestinal: positive for abdominal pain.  No nausea, no vomiting.  No diarrhea.  No constipation. Genitourinary: Negative for dysuria. Musculoskeletal: Negative for back pain. Skin: Negative for rash. Neurological: Negative for headaches, focal weakness or numbness.   ____________________________________________   PHYSICAL EXAM:  VITAL SIGNS: ED Triage Vitals  Enc Vitals Group     BP 03/29/17 0029 114/62     Pulse Rate 03/29/17 0029 71  Resp 03/29/17 0029 16     Temp 03/29/17 0029 97.8 F (36.6 C)     Temp Source 03/29/17 0029 Oral     SpO2 03/29/17 0029 100 %     Weight 03/29/17 0026 135 lb (61.2 kg)     Height 03/29/17 0026  (1.676 m)     Head Circumference --      Peak Flow --      Pain Score 03/29/17 0025 8     Pain Loc --      Pain Edu? --      Excl.  in GC? --     Constitutional: Soundly asleep, awakened for exam. Alert and oriented. Well appearing and in no acute distress. Eyes: Conjunctivae are normal. PERRL. EOMI. Head: Atraumatic. Nose: No congestion/rhinnorhea. Mouth/Throat: Mucous membranes are moist.  Oropharynx non-erythematous. Neck: No stridor.   Cardiovascular: Normal rate, regular rhythm. Grossly normal heart sounds.  Good peripheral circulation. Respiratory: Normal respiratory effort.  No retractions. Lungs CTAB. Gastrointestinal: Soft and mildly diffusely tender to palpation without rebound or guarding. Ileostomy bag in place with stool output. No distention. No abdominal bruits. No CVA tenderness. Musculoskeletal: No lower extremity tenderness nor edema.  No joint effusions. Neurologic:  Normal speech and language. No gross focal neurologic deficits are appreciated. No gait instability. Skin:  Skin is warm, dry and intact. No rash noted. Psychiatric: Mood and affect are normal. Speech and behavior are normal.  ____________________________________________   LABS (all labs ordered are listed, but only abnormal results are displayed)  Labs Reviewed  CBC WITH DIFFERENTIAL/PLATELET  COMPREHENSIVE METABOLIC PANEL   ____________________________________________  EKG  None ____________________________________________  RADIOLOGY  No results found.  ____________________________________________   PROCEDURES  Procedure(s) performed: None  Procedures  Critical Care performed: No  ____________________________________________   INITIAL IMPRESSION / ASSESSMENT AND PLAN / ED COURSE  As part of my medical decision making, I reviewed the following data within the electronic MEDICAL RECORD NUMBER Nursing notes reviewed and incorporated,, Labs reviewed, Old chart reviewed, Notes from prior ED visits.   26 year old male with ileostomy from Hirschsprung's disease who returns to the ED for his third visit in the last 2  weeks for generalized abdominal pain. This timing coincides with his move from [place]. Staying at the homeless shelter and has not filled any of his multiple prescriptions. Differential diagnosis includes, but is not limited to, biliary disease (biliary colic, acute cholecystitis, cholangitis, choledocholithiasis, etc), intrathoracic causes for epigastric abdominal pain including ACS, gastritis, duodenitis, pancreatitis, small bowel or large bowel obstruction, abdominal aortic aneurysm, hernia, and gastritis.  Patient has already had 2 CT scans within the past 2 weeks which demonstrated gastritis, fecalization of small bowel contents proximal to ileostomy suggesting slow transit, no evidence of SBO. Patient is not having associated nausea/vomiting, and there is stool in his ostomy bag. We discussed at length risk/benefits of obtaining a third CT scan at this time. I do not feel a plain film x-ray would be beneficial as it will likely show distention concerning for obstruction. Clinically I have low suspicion for SBO based on patient's symptoms and well-appearing clinical appearance. We agreed that I will check basic lab work; if white count is significantly elevated from prior visit and we will proceed with his third CT scan. Will recheck urinalysis as patient still has dysuria, most likely because he has not filled his antibiotic.  Clinical Course as of Mar 29 656  Mon Mar 29, 2017  0318 WBC within normal limits. Patient  is soundly asleep in no acute distress. Will consult social work for medication assistance as patient has not filled any of his numerous prescriptions and this is his third ED visit in the past 2 weeks.  [JS]  [id number] Patient soundly sleeping in no acute distress. Pending social work evaluation this morning. Doxycycline ordered to take when patient wakes up as he has not filled his prescription for antibiotics for his UTI.  [JS]    Clinical Course User Index [JS] Irean Hong, MD      ____________________________________________   FINAL CLINICAL IMPRESSION(S) / ED DIAGNOSES  Final diagnoses:  Generalized abdominal pain  Constipation, unspecified constipation type      NEW MEDICATIONS STARTED DURING THIS VISIT:  New Prescriptions   No medications on file     Note:  This document was prepared using Dragon voice recognition software and may include unintentional dictation errors.    Irean Hong, MD 03/29/17 [phone]

## 2017-03-29 NOTE — Clinical Social Work Note (Signed)
CSW received inappropriate consult for "Please assist with medications. Patient is homeless, staying at the shelter x 2 weeks since moving from [place]. Has not been able to fill any of the numerous prescriptions written for him. 3rd ED visit in the past 2 weeks.." Please consult care management. CSW to make care management aware, also.   CSW signing off as no further Social Work needs identified. Please reconsult if new Social Work needs arise.   Corlis Hove, Theresia Majors, [facility] Clinical Social Worker-ED [phone]

## 2017-03-29 NOTE — Care Management Note (Signed)
Case Management Note  Patient Details  Name: Kerry Ball MRN: [id number] Date of Birth: 08-Nov-1990  Subjective/Objective:           Consulted to help patient with home meds.He actually has active Medicaid and so is able to go anywhere for emds. I have talked to him about using [facility] for now, and if he runs into problems with coverage  At some time later on , gave him the application for [employer] across the street.  He says he lives at the shelter, and does not have a ride. I have explained we could give him a bus ticket.   He is not happy with that. I have shared the conversation with Dr Mayford Knife, ans he will provide the patient with scripts.       Action/Plan:   Expected Discharge Date:                  Expected Discharge Plan:     In-House Referral:     Discharge planning Services     Post Acute Care Choice:    Choice offered to:     DME Arranged:    DME Agency:     HH Arranged:    HH Agency:     Status of Service:     If discussed at [employer] of Stay Meetings, dates discussed:    Additional Comments:  Berna Bue, RN 03/29/2017, 10:52 AM

## 2017-03-29 NOTE — ED Notes (Signed)
Report to Dawn, RN.

## 2017-03-29 NOTE — ED Notes (Signed)
ED Provider at bedside. 

## 2017-03-29 NOTE — ED Triage Notes (Signed)
Patient brought in by ems from [employer]. Patient with complaint of generalized abdominal pain times two weeks. Patient denies nausea or vomiting. Patient was seen here for the same 4 days ago and states that he was told that he may have an obstruction.

## 2017-04-20 ENCOUNTER — Emergency Department: Payer: Medicaid Other

## 2017-04-20 ENCOUNTER — Emergency Department
Admission: EM | Admit: 2017-04-20 | Discharge: 2017-04-20 | Disposition: A | Discharge status: Home / Self Care | Payer: Medicaid Other | Attending: Emergency Medicine | Admitting: Emergency Medicine

## 2017-04-20 DIAGNOSIS — R1084 Generalized abdominal pain: Secondary | ICD-10-CM | POA: Diagnosis not present

## 2017-04-20 DIAGNOSIS — Z79899 Other long term (current) drug therapy: Secondary | ICD-10-CM | POA: Insufficient documentation

## 2017-04-20 DIAGNOSIS — R109 Unspecified abdominal pain: Secondary | ICD-10-CM

## 2017-04-20 DIAGNOSIS — G8929 Other chronic pain: Secondary | ICD-10-CM | POA: Insufficient documentation

## 2017-04-20 LAB — COMPREHENSIVE METABOLIC PANEL
ALT: 26 U/L (ref 17–63)
ANION GAP: 7 (ref 5–15)
AST: 28 U/L (ref 15–41)
Albumin: 3.6 g/dL (ref 3.5–5.0)
Alkaline Phosphatase: 44 U/L (ref 38–126)
BUN: 17 mg/dL (ref 6–20)
CALCIUM: 9.1 mg/dL (ref 8.9–10.3)
CHLORIDE: 103 mmol/L (ref 101–111)
CO2: 26 mmol/L (ref 22–32)
Creatinine, Ser: 1.31 mg/dL — ABNORMAL HIGH (ref 0.61–1.24)
GFR calc non Af Amer: >60 mL/min (ref >60)
Glucose, Bld: 97 mg/dL (ref 65–99)
POTASSIUM: 3.1 mmol/L — AB (ref 3.5–5.1)
SODIUM: 136 mmol/L (ref 135–145)
Total Bilirubin: 0.8 mg/dL (ref 0.3–1.2)
Total Protein: 6.8 g/dL (ref 6.5–8.1)

## 2017-04-20 LAB — CBC
HCT: 43.6 % (ref 40.0–52.0)
HEMOGLOBIN: 14.6 g/dL (ref 13.0–18.0)
MCH: 29.4 pg (ref 26.0–34.0)
MCHC: 33.3 g/dL (ref 32.0–36.0)
MCV: 88.1 fL (ref 80.0–100.0)
Platelets: 244 10*3/uL (ref 150–440)
RBC: 4.95 MIL/uL (ref 4.40–5.90)
RDW: 13.4 % (ref 11.5–14.5)
WBC: 4.9 10*3/uL (ref 3.8–10.6)

## 2017-04-20 LAB — LIPASE, BLOOD: LIPASE: 36 U/L (ref 11–51)

## 2017-04-20 MED ORDER — DICYCLOMINE HCL 10 MG PO CAPS: 10.0000 mg | ORAL_CAPSULE | Freq: Three times a day (TID) | ORAL | 0 refills | Status: AC | PRN

## 2017-04-20 MED ORDER — DICYCLOMINE HCL 10 MG PO CAPS
10.0000 mg | ORAL_CAPSULE | Freq: Once | ORAL | Status: AC
  Administered 2017-04-20: 10 mg via ORAL
  Filled 2017-04-20: qty 1

## 2017-04-20 NOTE — ED Triage Notes (Signed)
Patient reporting abdominal pain and decreased output from his colostomy.

## 2017-04-20 NOTE — ED Notes (Signed)
Pt states that he has decreased output from colostomy bag and abdominal pain. Started a few days ago.

## 2017-04-20 NOTE — Discharge Instructions (Signed)
You have been seen in the Emergency Department (ED) for abdominal pain.  Your evaluation did not identify a clear cause of your symptoms but was generally reassuring.  Please follow up as instructed above regarding today's emergent visit and the symptoms that are bothering you.  As we discussed, we cannot provide narcotics for chronic pain according to the [place] policy.  Return to the ED if your abdominal pain worsens or fails to improve, you develop bloody vomiting, bloody diarrhea, you are unable to tolerate fluids due to vomiting, fever greater than 101, or other symptoms that concern you.  

## 2017-04-20 NOTE — ED Provider Notes (Signed)
[facility] Emergency Department Provider Note  ____________________________________________   First MD Initiated Contact with Patient 04/20/17 [phone]     (approximate)  I have reviewed the triage vital signs and the nursing notes.   HISTORY  Chief Complaint Abdominal Pain    HPI Kerry Ball is a 26 y.o. male with medical history as listed below who presents for evaluation of abdominal pain and decreased output from his colostomy bag.  He moved to [place] from [place] a few months ago and has been seen 5 times in the [facility] emergency department with the same symptoms over the last 6 months.  He had Hirschsprung's disease which is the cause of his colostomy.  Since coming to [place] he lived in the shelter and initially and came over multiple times to the emergency department from the shelter, but he states that he now lives in a home and intends to stay in [place].  Because his status was uncertain he did not set up any outpatient providers with whom he can follow-up over the last multiple visits.  He was sleeping heavily when I evaluated him but awakened to my voice.  He states his symptoms are the same as usual; he reports intermittent but gradually worsening abdominal pain over the last several days that is now severe.  He is able to eat and drink.  He occasionally has some nausea but has not been vomiting.  He feels like there is less output in the colostomy bag than usual.  He denies fever/chills, chest pain, shortness of breath, dysuria.  He was treated for gonorrhea within the last month and was consistently noncompliant with obtaining outpatient medications.  Past Medical History:  Diagnosis Date  . Hirschsprung disease of rectosigmoid region     There are no active problems to display for this patient.   Past Surgical History:  Procedure Laterality Date  . ILEOSTOMY  1992    Prior to Admission  medications   Medication Sig Start Date End Date Taking? Authorizing Provider  dicyclomine (BENTYL) 10 MG capsule Take 1 capsule (10 mg total) by mouth 3 (three) times daily as needed for spasms. 04/20/17 05/04/17  Loleta Rose, MD  doxycycline (VIBRAMYCIN) 50 MG capsule Take 2 capsules (100 mg total) by mouth 2 (two) times daily. 03/29/17   Emily Filbert, MD  metoCLOPramide (REGLAN) 10 MG tablet Take 1 tablet (10 mg total) by mouth 4 (four) times daily. 03/29/17   Emily Filbert, MD  omeprazole (PRILOSEC OTC) 20 MG tablet Take 1 tablet (20 mg total) by mouth daily. 03/29/17 03/29/18  Emily Filbert, MD  sucralfate (CARAFATE) 1 g tablet Take 1 tablet (1 g total) by mouth 4 (four) times daily as needed (for abdominal discomfort, nausea, and/or vomiting). 03/29/17   Emily Filbert, MD    Allergies Patient has no known allergies.  No family history on file.  Social History Social History  Substance Use Topics  . Smoking status: Never Smoker  . Smokeless tobacco: Never Used  . Alcohol use No    Review of Systems Constitutional: No fever/chills Eyes: No visual changes. ENT: No sore throat. Cardiovascular: Denies chest pain. Respiratory: Denies shortness of breath. Gastrointestinal: +abdominal pain.  No nausea, no vomiting.  No diarrhea.  Reports decreased output to his colostomy bag Genitourinary: Negative for dysuria. Musculoskeletal: Negative for neck pain.  Negative for back pain. Integumentary: Negative for rash. Neurological: Negative for headaches, focal weakness or numbness.  ____________________________________________   PHYSICAL EXAM:  VITAL SIGNS: ED Triage Vitals  Enc Vitals Group     BP 04/20/17 0247 109/63     Pulse Rate 04/20/17 0247 77     Resp 04/20/17 0247 18     Temp 04/20/17 0247 98.1 F (36.7 C)     Temp Source 04/20/17 0247 Oral     SpO2 04/20/17 0247 98 %     Weight 04/20/17 0249 60.3 kg (133 lb)     Height 04/20/17 0249 1.676  m (5\' 6" )     Head Circumference --      Peak Flow --      Pain Score 04/20/17 0258 9     Pain Loc --      Pain Edu? --      Excl. in GC? --     Constitutional: Alert and oriented. Well appearing and in no acute distress. Eyes: Conjunctivae are normal.  Head: Atraumatic. Nose: No congestion/rhinnorhea. Mouth/Throat: Mucous membranes are moist. Neck: No stridor.  No meningeal signs.   Cardiovascular: Normal rate, regular rhythm. Good peripheral circulation. Grossly normal heart sounds. Respiratory: Normal respiratory effort.  No retractions. Lungs CTAB. Gastrointestinal: Soft with diffuse tenderness throughout.  Large volume of output in the colostomy bag at this time, normal appearance. Musculoskeletal: No lower extremity tenderness nor edema. No gross deformities of extremities. Neurologic:  Normal speech and language. No gross focal neurologic deficits are appreciated.  Skin:  Skin is warm, dry and intact. No rash noted. Psychiatric: Mood and affect are normal. Speech and behavior are normal.  ____________________________________________   LABS (all labs ordered are listed, but only abnormal results are displayed)  Labs Reviewed  COMPREHENSIVE METABOLIC PANEL - Abnormal; Notable for the following:       Result Value   Potassium 3.1 (*)    Creatinine, Ser 1.31 (*)    All other components within normal limits  LIPASE, BLOOD  CBC  URINALYSIS, COMPLETE (UACMP) WITH MICROSCOPIC   ____________________________________________  EKG  None - EKG not ordered by ED physician ____________________________________________  RADIOLOGY   Dg Abdomen Acute W/chest  Result Date: 04/20/2017 CLINICAL DATA:  Persistent abdominal pain. Decreased colostomy output today. EXAM: DG ABDOMEN ACUTE W/ 1V CHEST COMPARISON:  CT 03/22/2017 FINDINGS: The cardiomediastinal contours are normal. The lungs are clear. There is no free intra-abdominal air. No dilated bowel loops to suggest obstruction.  Right lower quadrant ostomy noted with adjacent surgical sutures. 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 No radiopaque calculi. No acute osseous abnormalities are seen. Hemi transitional lumbosacral anatomy is incidentally noted. IMPRESSION: 1. No bowel obstruction or free air.  Normal bowel gas pattern. 2. Clear lungs. Electronically Signed   By: Rubye Oaks M.D.   On: 04/20/2017 06:04    ____________________________________________   PROCEDURES  Critical Care performed: No   Procedure(s) performed:   Procedures   ____________________________________________   INITIAL IMPRESSION / ASSESSMENT AND PLAN / ED COURSE  As part of my medical decision making, I reviewed the following data within the electronic MEDICAL RECORD NUMBER Nursing notes reviewed and incorporated, Labs reviewed  and Notes from prior ED visits   Differential diagnosis includes, but is not limited to, acute appendicitis, renal colic, testicular torsion, urinary tract infection/pyelonephritis, prostatitis,  epididymitis, diverticulitis, small bowel obstruction or ileus, colitis, abdominal aortic aneurysm, gastroenteritis, hernia, etc.  Lab results are reassuring except for a mildly decreased potassium level.  The patient is in no acute distress and was sleeping comfortably when I checked on  him.  His vital signs are normal and consistent with prior.  He has no leukocytosis.  Given that he has had multiple recent CT scans I obtained acute abdomen radiographs and there is no evidence of an obstructive pattern.  I explained to the patient that he is suffering from chronic abdominal pain and needs to establish an outpatient provider but that we cannot provide narcotics.  I gave him another prescription for Bentyl which is been prescribed in the past and I gave him follow-up information with general surgery and GI, as well as the patient navigator phone number to call to set up a PCP.  I gave my usual and customary return  precautions.     ____________________________________________  FINAL CLINICAL IMPRESSION(S) / ED DIAGNOSES  Final diagnoses:  Chronic abdominal pain     MEDICATIONS GIVEN DURING THIS VISIT:  Medications  dicyclomine (BENTYL) capsule 10 mg (10 mg Oral Given 04/20/17 0758)     NEW OUTPATIENT MEDICATIONS STARTED DURING THIS VISIT:  Discharge Medication List as of 04/20/2017  7:24 AM    START taking these medications   Details  dicyclomine (BENTYL) 10 MG capsule Take 1 capsule (10 mg total) by mouth 3 (three) times daily as needed for spasms., Starting Tue 04/20/2017, Until Tue 05/04/2017, Print        Discharge Medication List as of 04/20/2017  7:24 AM      Discharge Medication List as of 04/20/2017  7:24 AM    STOP taking these medications     dicyclomine (BENTYL) 20 MG tablet Comments:  Reason for Stopping:           Note:  This document was prepared using Dragon voice recognition software and may include unintentional dictation errors.    Loleta RoseForbach, Kenza Munar, MD 04/20/17 [phone]

## 2019-05-10 IMAGING — CT CT ABD-PELV W/ CM
2 of 4 series · 15 of 46 positions shown, 17 images · IV contrast (iopamidol)
Comparison: None.

CLINICAL DATA: Generalized abdominal pain. Ileostomy with decreased
output.

EXAM:
CT ABDOMEN AND PELVIS WITH CONTRAST
TECHNIQUE: Multidetector CT imaging of the abdomen and pelvis was performed
using the standard protocol following bolus administration of
intravenous contrast.
CONTRAST:  100mL [id number] IOPAMIDOL ([id number]) INJECTION 61%

[Series 2: routine abd/pel with · axial · 0.57mm/px · z∈[-923,-533]mm · 12 of 86 slices shown, 14 images]
[im 4/86  soft-tissue]
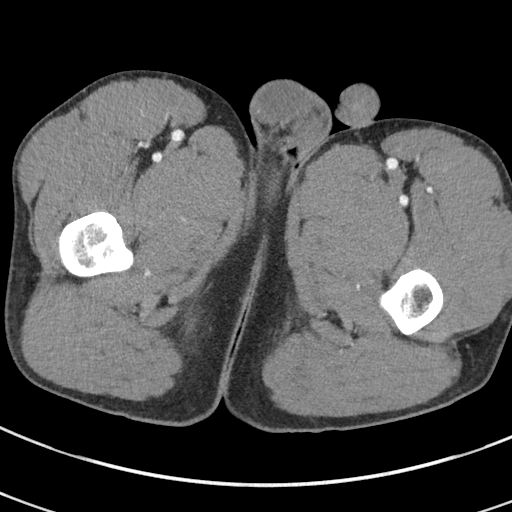
[im 4/86  bone]
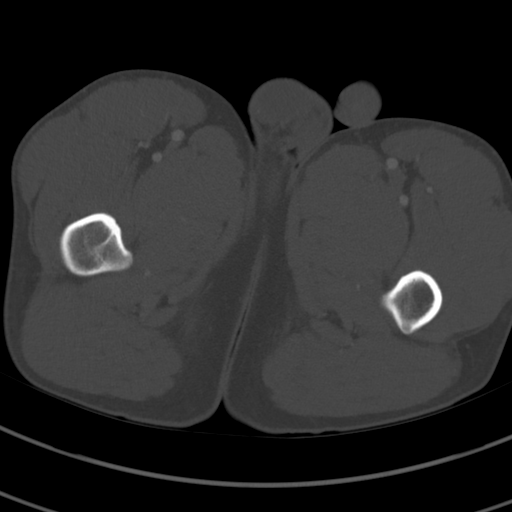
[im 11/86  soft-tissue]
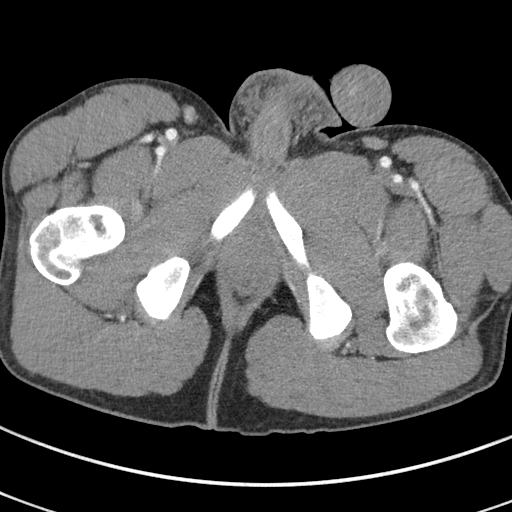
[im 18/86  soft-tissue]
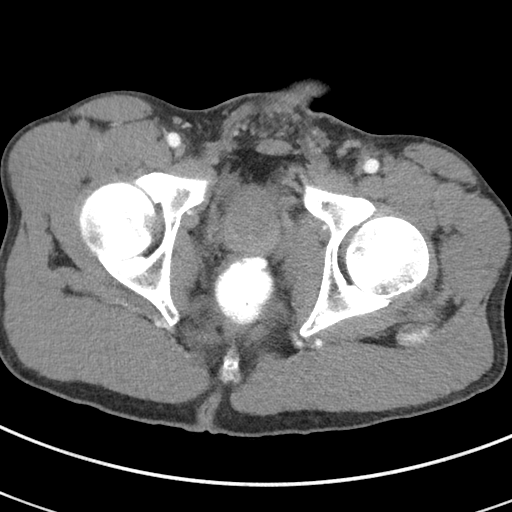
[im 25/86  soft-tissue]
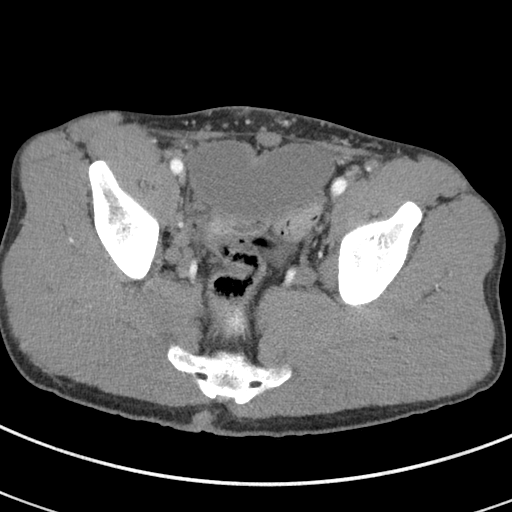
[im 32/86  soft-tissue]
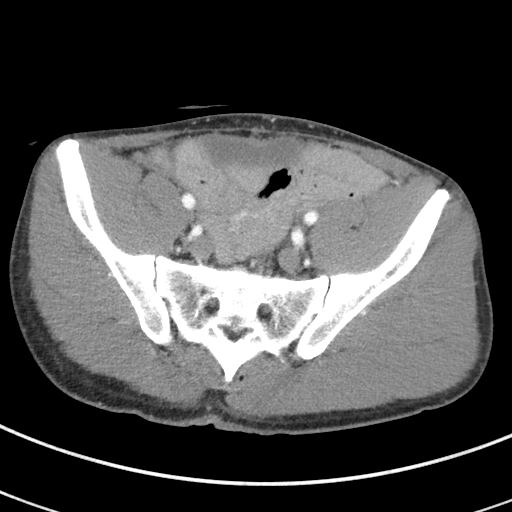
[im 39/86  soft-tissue]
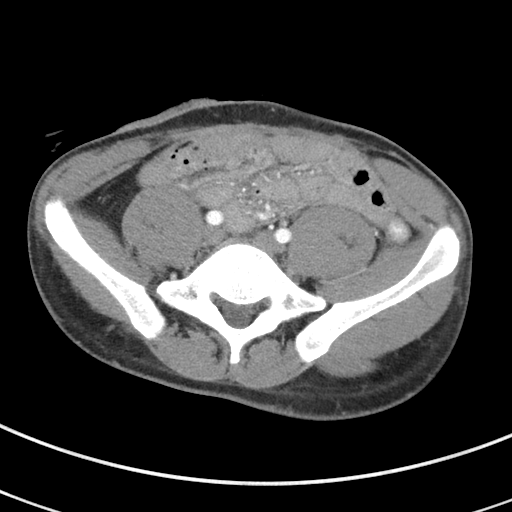
[im 47/86  soft-tissue]
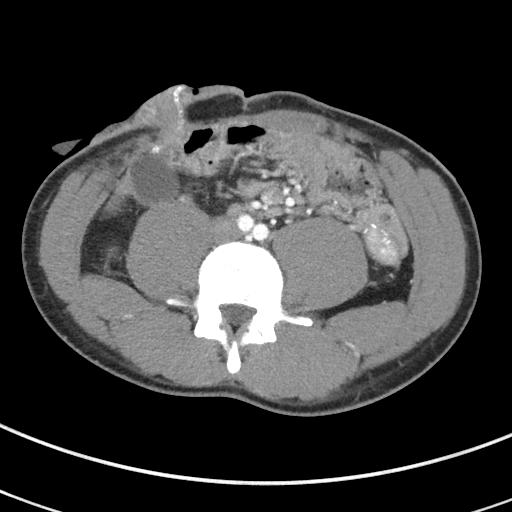
[im 54/86  soft-tissue]
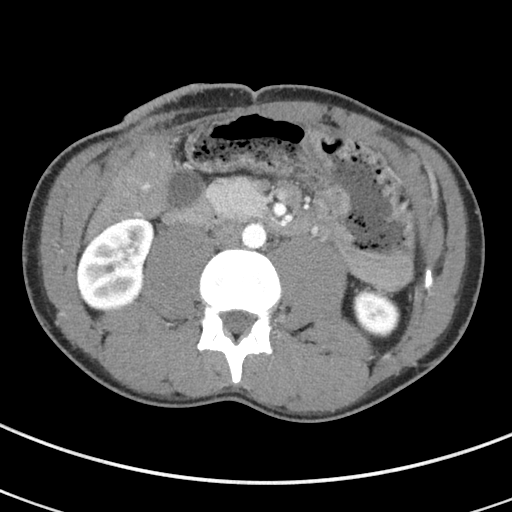
[im 61/86  soft-tissue]
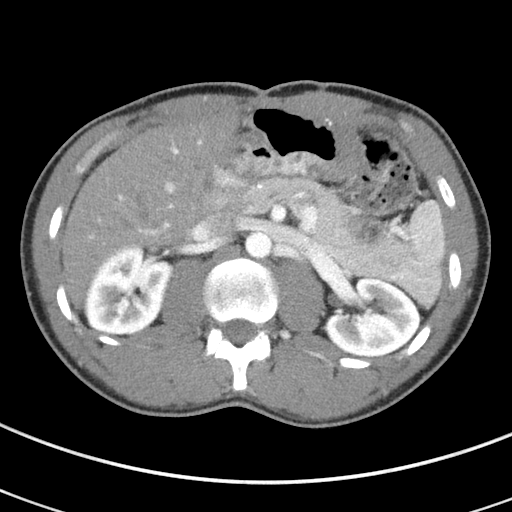
[im 61/86  bone]
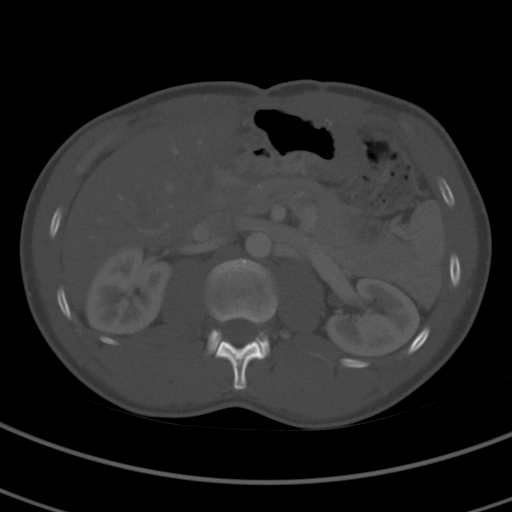
[im 68/86  soft-tissue]
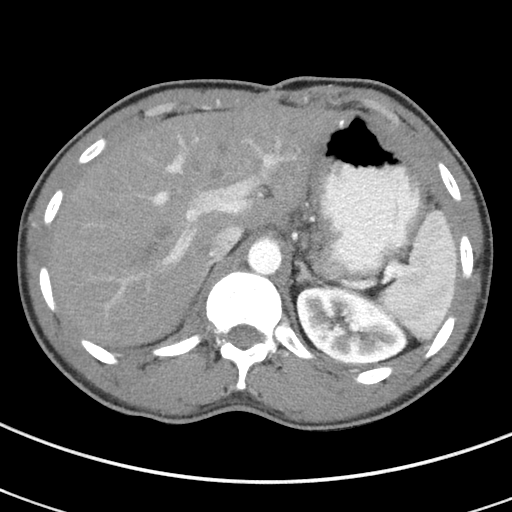
[im 75/86  soft-tissue]
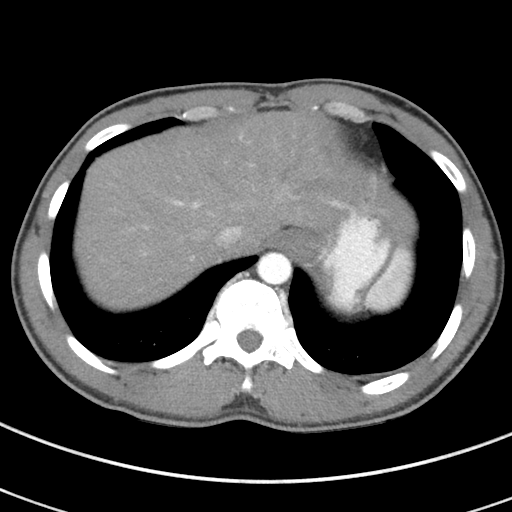
[im 82/86  soft-tissue]
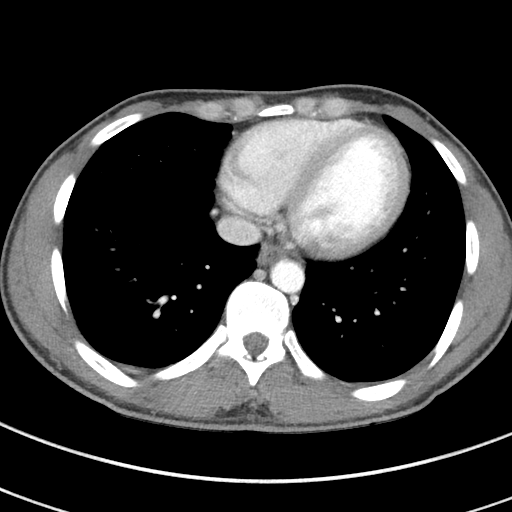

[Series 5: coronal st · coronal · 0.63mm/px · 3 of 64 slices shown]
[im 22/64  soft-tissue]
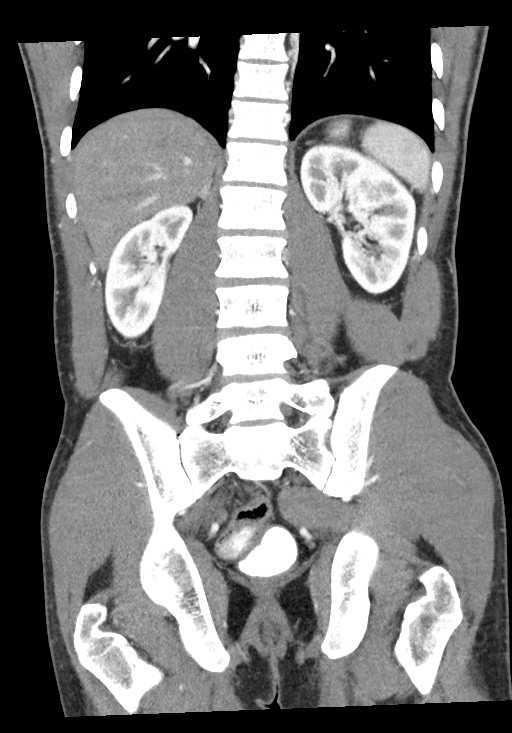
[im 29/64  soft-tissue]
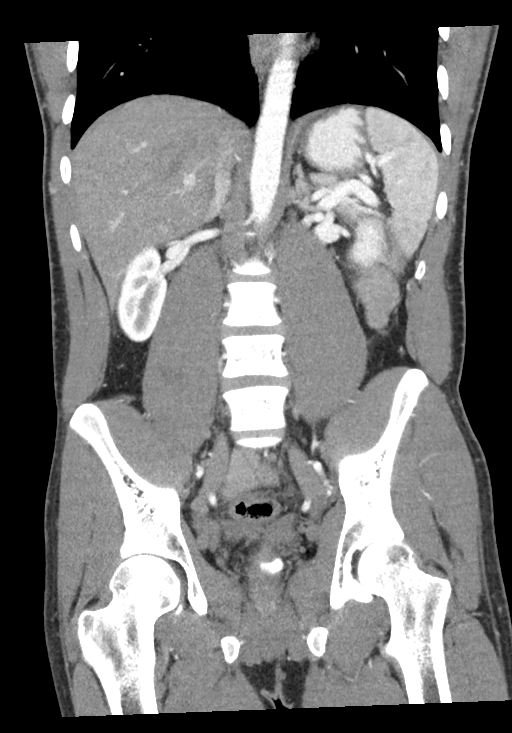
[im 36/64  soft-tissue]
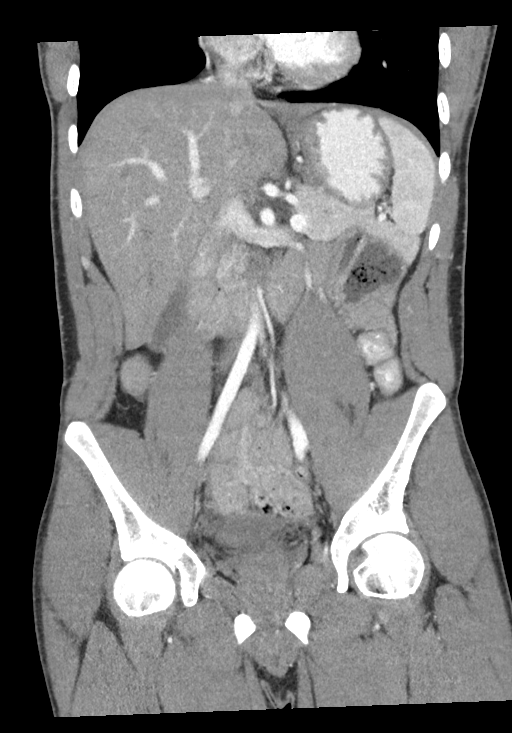

[15 of 46 positions shown; findings below may reference images not displayed]

FINDINGS: Lower chest: The lung bases are clear.

Hepatobiliary: No focal liver abnormality is seen. No gallstones,
gallbladder wall thickening, or biliary dilatation.

Pancreas: No ductal dilatation or inflammation.

Spleen: Normal in size without focal abnormality.

Adrenals/Urinary Tract: Adrenal glands are unremarkable. Kidneys are
normal, without renal calculi, focal lesion, or hydronephrosis.
Bladder is anteriorly located in the pelvis without wall thickening
or inflammatory change.

Stomach/Bowel: There is enteric contrast in the stomach and possibly
duodenum. No contrast within the remainder of small bowel. Mild
gastric wall thickening about the greater curvature. Right lower
quadrant ileostomy without parastomal hernia. There is fecalization
of small bowel contents proximal to the ileostomy without small
bowel inflammation. Small bowel is otherwise decompressed. Stapled
off descending and sigmoid colon contain high-density contrast which
is likely chronic. There is no evidence of bowel wall thickening or
inflammatory change.

Vascular/Lymphatic: No significant vascular findings are present. No
enlarged abdominal or pelvic lymph nodes.

Reproductive: Prostate is unremarkable.

Other: No free air or ascites. No mesenteric edema. No
intra-abdominal abscess.

Musculoskeletal: There are no acute or suspicious osseous
abnormalities.
IMPRESSION: 1. Right lower quadrant ileostomy with fecalization of small bowel
contents proximal to the ileostomy, suggesting slow transit. No
associated bowel inflammation or evidence of obstruction.
2. Mild gastric wall thickening of the greater curvature, gastritis
versus peptic ulcer disease.

## 2019-05-11 IMAGING — CR DG ABDOMEN ACUTE W/ 1V CHEST
4 series · 4 of 4 positions shown · non-contrast
Comparison: 03/20/2017

CLINICAL DATA: Abdominal pain.  Colostomy.

EXAM:
DG ABDOMEN ACUTE W/ 1V CHEST

[chest pa]
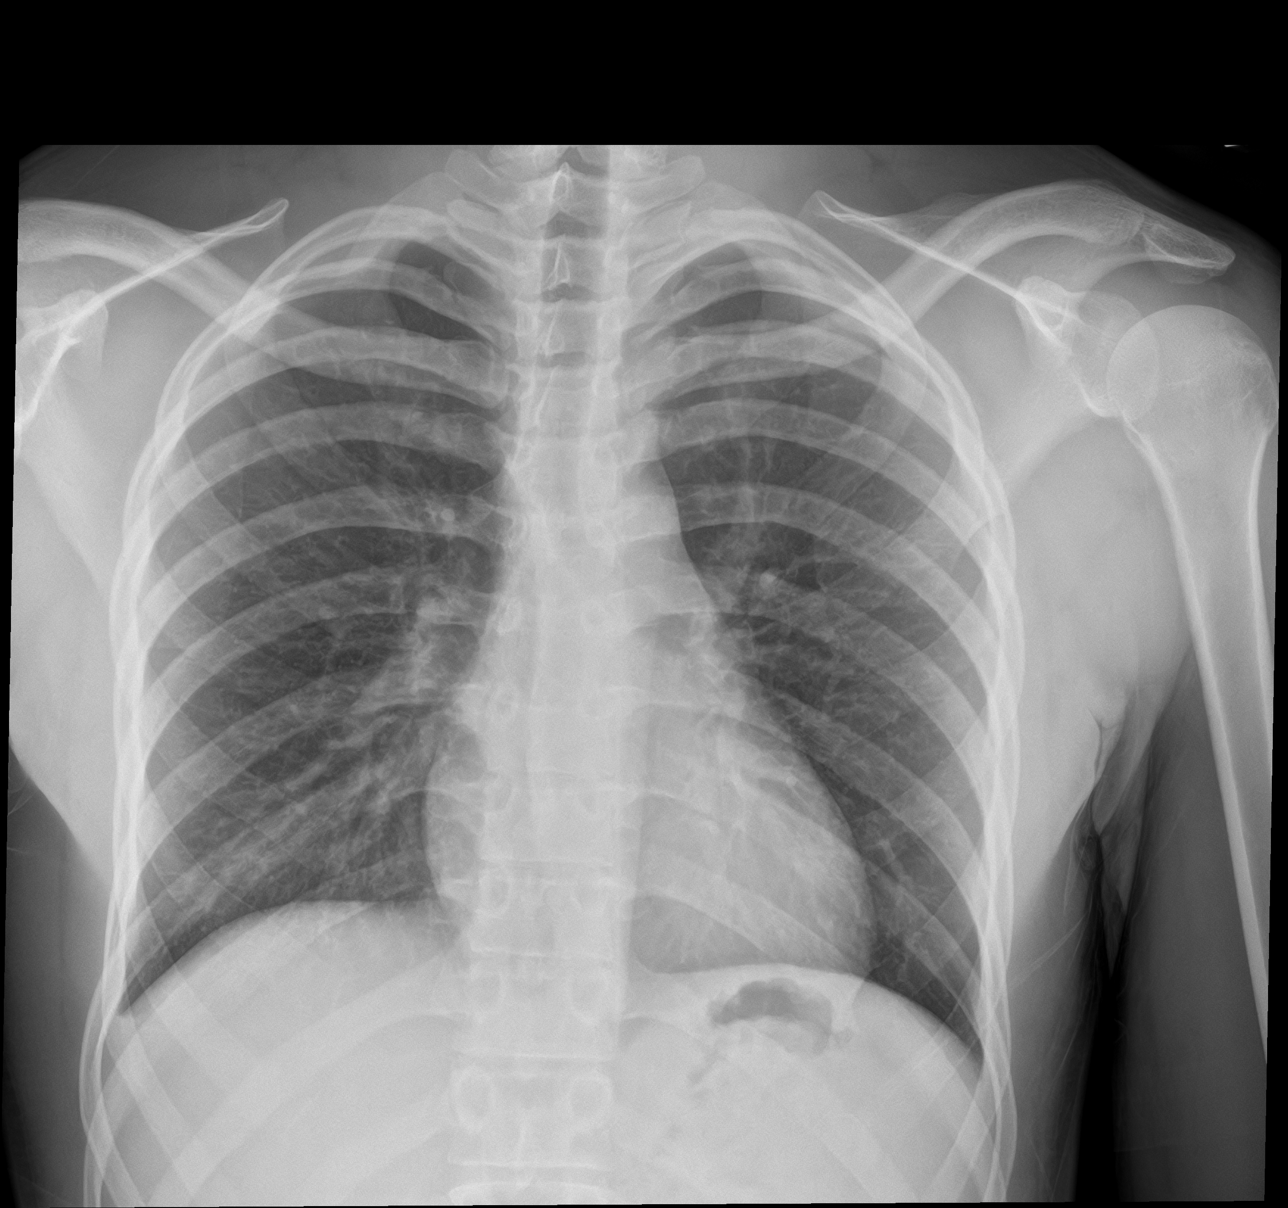

[abdomen erect]
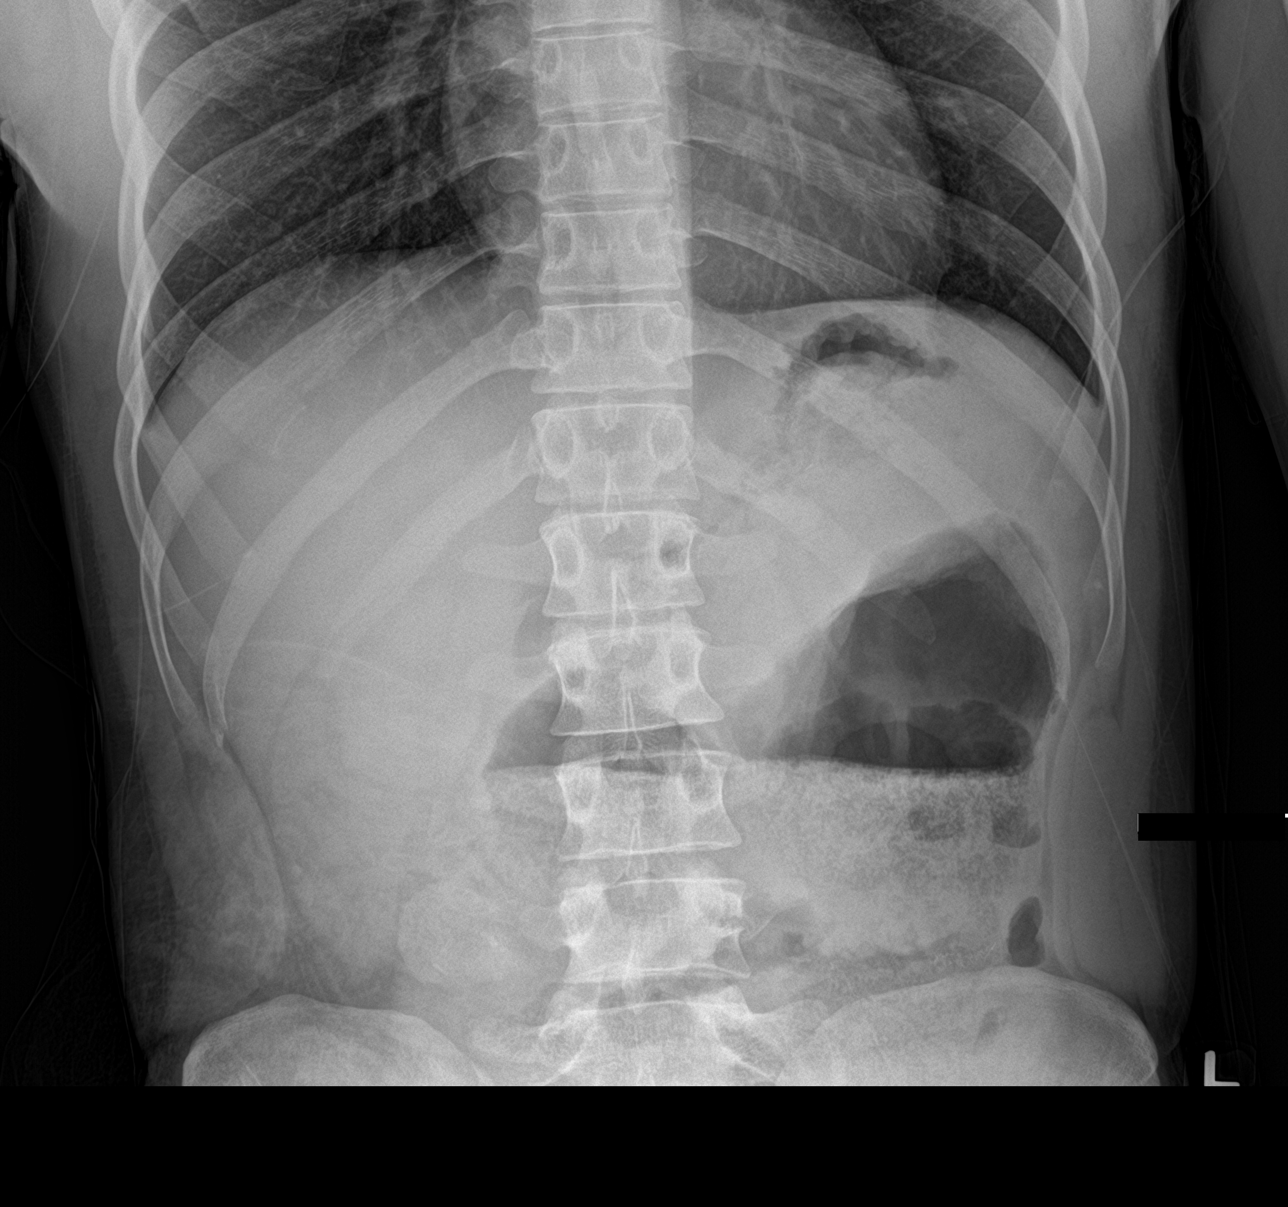

[abdomen supine (1 of 2)]
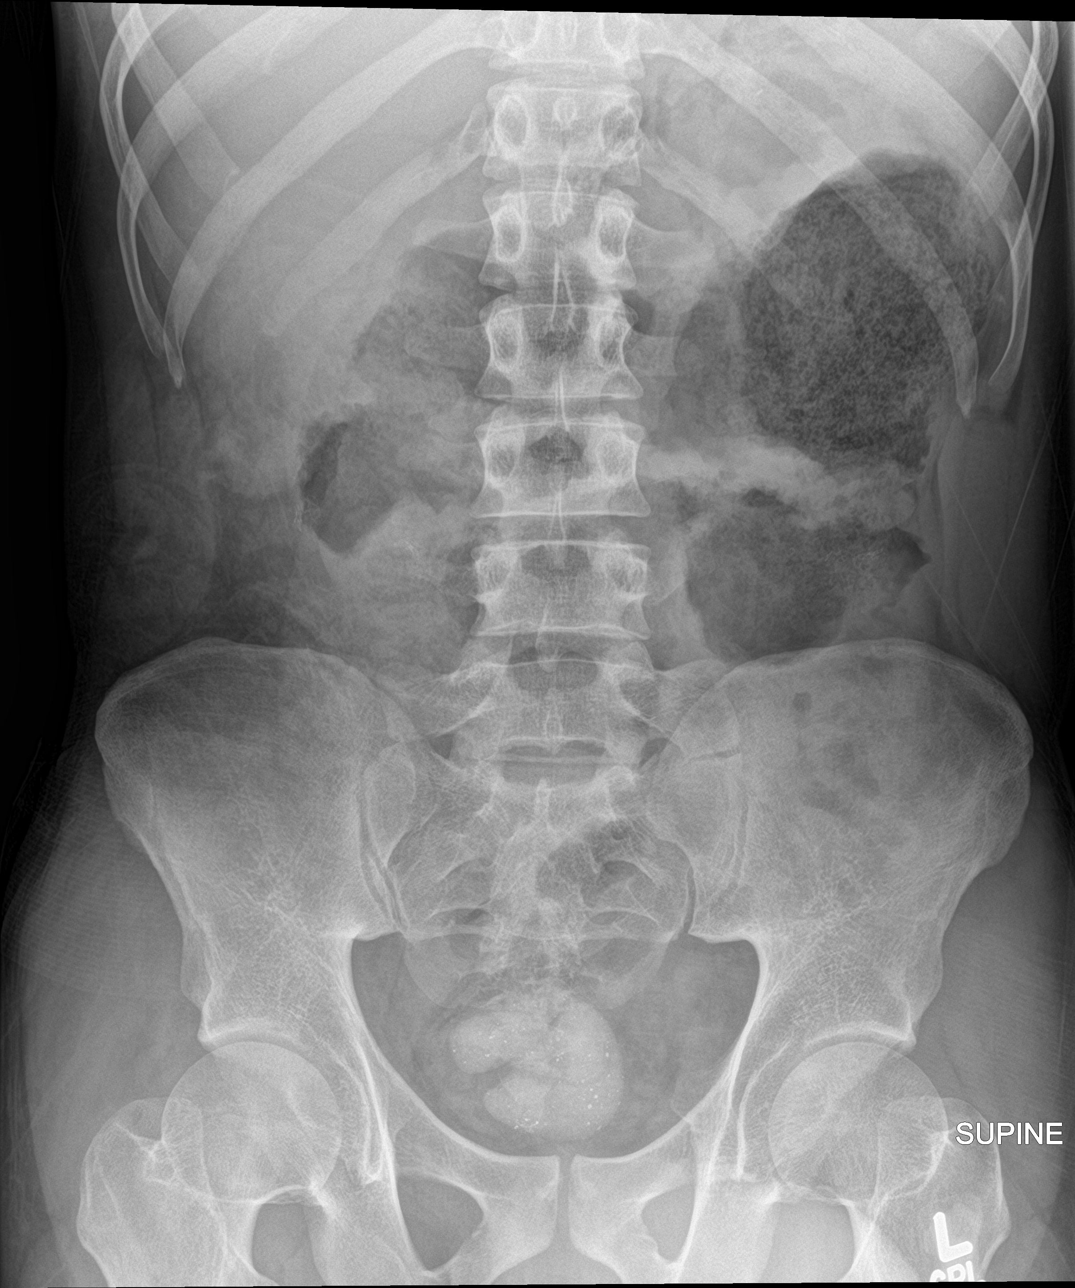

[abdomen supine (2 of 2)]
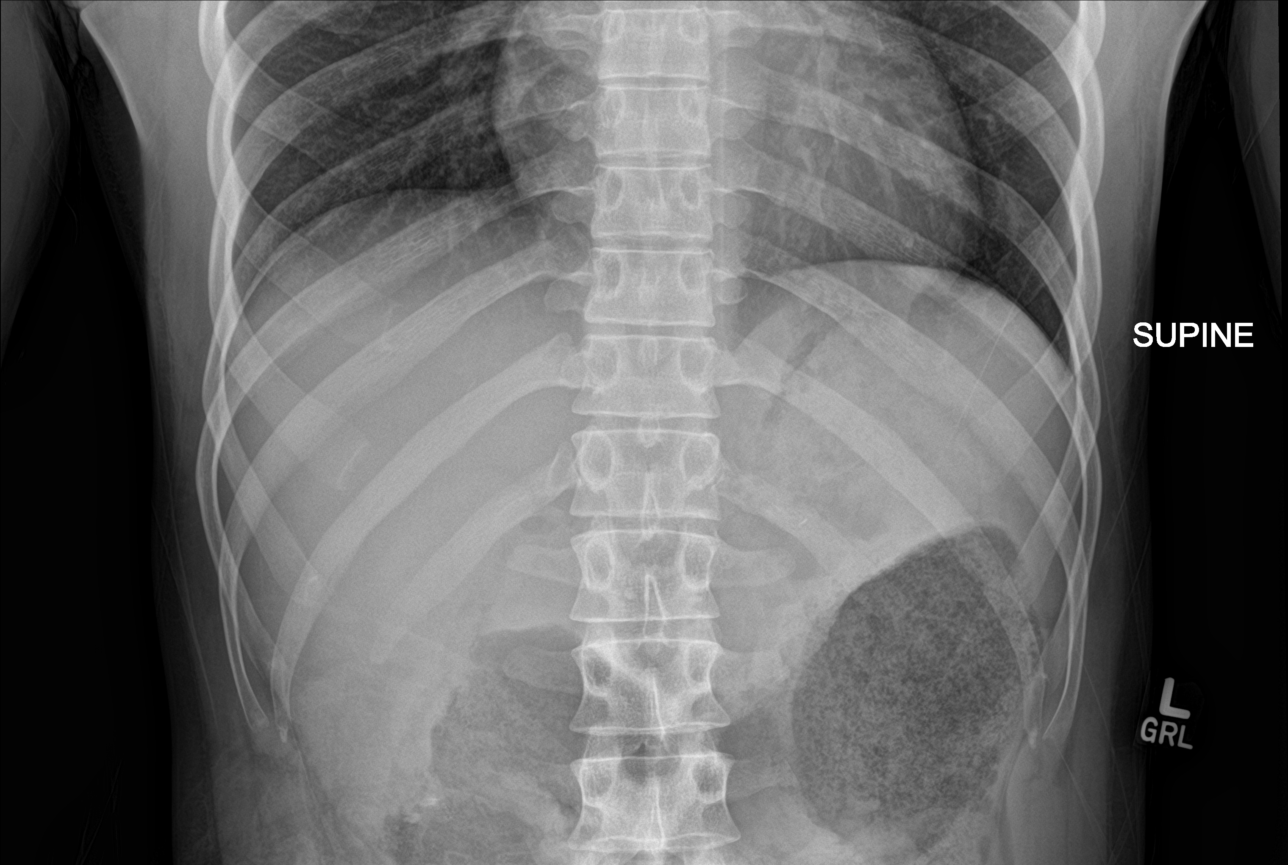

[4 of 4 positions shown; findings below may reference images not displayed]

FINDINGS: There has been significant increase in small bowel dilatation with
fecalization of small bowel contents. Cannot rule out small bowel
obstruction of the level of the ileostomy. Heart size and
mediastinal contours are within normal limits. Both lungs are clear.
IMPRESSION: 1. Progressive distension of small bowel loops containing a
fecalized contents. Consider further evaluation with CT of the
abdomen and pelvis with oral and IV contrast material.

## 2021-08-07 ENCOUNTER — Emergency Department: Admit: 2021-08-07 | Payer: PRIVATE HEALTH INSURANCE

## 2021-08-07 ENCOUNTER — Emergency Department: Admit: 2021-08-07 | Payer: Medicaid (Managed Care)

## 2021-08-07 ENCOUNTER — Observation Stay
Admit: 2021-08-07 | Discharge: 2021-08-10 | Discharge status: Against Medical Advice | Payer: PRIVATE HEALTH INSURANCE | Admitting: Hospitalist

## 2021-08-07 DIAGNOSIS — R1084 Generalized abdominal pain: Secondary | ICD-10-CM

## 2021-08-07 DIAGNOSIS — T182XXA Foreign body in stomach, initial encounter: Secondary | ICD-10-CM

## 2021-08-07 LAB — CBC WITH AUTO DIFFERENTIAL
Absolute Immature Granulocyte: 0 10*3/uL (ref 0.00–0.04)
Basophils %: 1 % (ref 0–2)
Basophils Absolute: 0 10*3/uL (ref 0.0–0.1)
Eosinophils %: 0 % (ref 0–5)
Eosinophils Absolute: 0 10*3/uL (ref 0.0–0.4)
Hematocrit: 47.5 % (ref 36.0–48.0)
Hemoglobin: 16.1 g/dL — ABNORMAL HIGH (ref 13.0–16.0)
Immature Granulocytes: 0 % (ref 0–0.5)
Lymphocytes %: 39 % (ref 21–52)
Lymphocytes Absolute: 2.5 10*3/uL (ref 0.9–3.6)
MCH: 30.5 PG (ref 24.0–34.0)
MCHC: 33.9 g/dL (ref 31.0–37.0)
MCV: 90 FL (ref 78.0–100.0)
MPV: 9.9 FL (ref 9.2–11.8)
Monocytes %: 12 % — ABNORMAL HIGH (ref 3–10)
Monocytes Absolute: 0.8 10*3/uL (ref 0.05–1.2)
Neutrophils %: 48 % (ref 40–73)
Neutrophils Absolute: 3 10*3/uL (ref 1.8–8.0)
Nucleated RBCs: 0 PER 100 WBC
Platelets: 265 10*3/uL (ref 135–420)
RBC: 5.28 M/uL (ref 4.35–5.65)
RDW: 12.9 % (ref 11.6–14.5)
WBC: 6.3 10*3/uL (ref 4.6–13.2)
nRBC: 0 10*3/uL (ref 0.00–0.01)

## 2021-08-07 LAB — COMPREHENSIVE METABOLIC PANEL
ALT: 38 U/L (ref 16–61)
AST: 20 U/L (ref 10–38)
Albumin/Globulin Ratio: 0.7 — ABNORMAL LOW (ref 0.8–1.7)
Albumin: 3.2 g/dL — ABNORMAL LOW (ref 3.4–5.0)
Alk Phosphatase: 63 U/L (ref 45–117)
Anion Gap: 6 mmol/L (ref 3.0–18.0)
BUN: 7 mg/dL (ref 7–18)
Bun/Cre Ratio: 7 — ABNORMAL LOW (ref 12–20)
CO2: 27 mmol/L (ref 21–32)
Calcium: 9 mg/dL (ref 8.5–10.1)
Chloride: 108 mmol/L (ref 100–111)
Creatinine: 1.04 mg/dL (ref 0.60–1.30)
Est, Glom Filt Rate: >60 mL/min/{1.73_m2} (ref >60)
Globulin: 4.3 g/dL — ABNORMAL HIGH (ref 2.0–4.0)
Glucose: 71 mg/dL — ABNORMAL LOW (ref 74–99)
Potassium: 3.1 mmol/L — ABNORMAL LOW (ref 3.5–5.5)
Sodium: 141 mmol/L (ref 136–145)
Total Bilirubin: 1.3 mg/dL — ABNORMAL HIGH (ref 0.2–1.0)
Total Protein: 7.5 g/dL (ref 6.4–8.2)

## 2021-08-07 LAB — LIPASE: Lipase: 142 U/L (ref 73–393)

## 2021-08-07 LAB — LACTIC ACID: Lactic Acid, Plasma: 1.6 mmol/L (ref 0.4–2.0)

## 2021-08-07 MED ORDER — ONDANSETRON HCL 4 MG/2ML IJ SOLN
42 MG/2ML | INTRAMUSCULAR | Status: AC
Start: 2021-08-07 — End: 2021-08-07
  Administered 2021-08-07: 23:00:00 4 mg via INTRAVENOUS

## 2021-08-07 MED ORDER — MORPHINE SULFATE (PF) 2 MG/ML IV SOLN
2 MG/ML | INTRAVENOUS | Status: AC
Start: 2021-08-07 — End: 2021-08-07

## 2021-08-07 MED ORDER — MORPHINE SULFATE (PF) 2 MG/ML IV SOLN
2 MG/ML | INTRAVENOUS | Status: AC
Start: 2021-08-07 — End: 2021-08-07
  Administered 2021-08-08: 2 via INTRAVENOUS

## 2021-08-07 MED FILL — MORPHINE SULFATE (PF) 2 MG/ML IV SOLN: 2 mg/mL | INTRAVENOUS | Qty: 1

## 2021-08-07 MED FILL — ONDANSETRON HCL 4 MG/2ML IJ SOLN: 4 MG/2ML | INTRAMUSCULAR | Qty: 2

## 2021-08-07 NOTE — Other (Signed)
Never told allergy to latex or items containing latex

## 2021-08-07 NOTE — H&P (Signed)
History and Physical    Subjective:     Patrick Villegas is a 31 y.o. male with a past medical history significant for Hirschsprung disease, and chronic RLQ ileostomy who presents to the ED today with complaints of abdominal pain.  History is obtained from the patient in the ED. patient complains of abdominal pain in the last 2 weeks with intermittent nausea and vomiting, without any particular relieving or exacerbating factors.  He states "everything I eat just come back right out", he states he noted that his ostomy output has decreased in quantity and he suspected he may have some obstruction so today decided to figure out if he had some obstruction.  He took a stick and put it through his stoma site and he met some resistance he got the stick out, he had 3 AA batteries taped together and inserted through the stoma site unfortunately the tape loosened off and 2 batteries were not able to be retrieved from his stoma.  This prompted the ED visit.  At the time of my evaluation in the ED, he is grimacing his face and pain, noted with guarding and complaints of diffuse abdominal tenderness.  CT of the abdomen/pelvis shows   There are 2 cylindrical radiopaque foreign bodies, likely batteries, within a   distended loop of colon in the right mid abdomen, which is immediately proximal   to a right mid abdominal colostomy. The associated loop of colon is distended,   up to 7 cm. No evidence of perforation.   2. Mild small bowel dilatation in the right lower quadrant, up to 3.9 cm, which   may be due to a surgical adhesion.   3. No evidence of bowel perforation.     Per ED provider, Dr. Brigid Re was consulted in the ED, recommends admission, NG tube insertion, n.p.o., IV fluids hospitalist was asked to admit.      Past Medical History:   Diagnosis Date    Congenital megacolon     Hirschsprung disease      Past Surgical History:   Procedure Laterality Date    COLOSTOMY       No family history on file.   Social History      Tobacco Use    Smoking status: Not on file    Smokeless tobacco: Not on file   Substance Use Topics    Alcohol use: Not on file       Prior to Admission medications    Not on File     No Known Allergies     Review of Systems:  Negative except as mentioned in HPI.       Objective:     Vitals:  BP (!) 154/68    Pulse 83    Temp 98.2 ??F (36.8 ??C) (Oral)    Resp 16    Ht '5\' 5"'  (1.651 m)    Wt 154 lb (69.9 kg)    SpO2 95%    BMI 25.63 kg/m??     Physical Exam:  General: Alert, awake, orients x4, cooperative, no distress.  On room air.  Head:  Normocephalic, without obvious abnormality, atraumatic.  Eyes: Conjunctivae/corneas clear. Pupils equal, round, reactive to light. Extraocular movements intact.  No icterus.  Lungs: Clear to auscultation bilaterally, no wheezes, no crackles.  Chest wall: No tenderness or deformity.  Heart: Regular rate and rhythm, S1, S2 normal, no murmur, click, rub, or gallop.  Abdomen: Abdomen is flat, soft, with diffuse tenderness to light palpation in all quadrants. +  ve gaurding. No rebound. RLQ ileostomy is patent with loose yellow stools. Multiple midline abdominal surgical scars.  Extremities:Extremities normal, atraumatic, no cyanosis or peripheral edema.  Pulses: 2+ and symmetric all extremities.  Skin: Not pale, not jaundiced, overall skin color, texture, turgor normal.   Lymph nodes: Cervical nodes normal.  Neurologic: Awake and oriented, x4.  Nonfocal.      Labs:  Recent Results (from the past 24 hour(s))   CBC with Diff    Collection Time: 08/07/21  5:37 PM   Result Value Ref Range    WBC 6.3 4.6 - 13.2 K/uL    RBC 5.28 4.35 - 5.65 M/uL    Hemoglobin 16.1 (H) 13.0 - 16.0 g/dL    Hematocrit 47.5 36.0 - 48.0 %    MCV 90.0 78.0 - 100.0 FL    MCH 30.5 24.0 - 34.0 PG    MCHC 33.9 31.0 - 37.0 g/dL    RDW 12.9 11.6 - 14.5 %    Platelets 265 135 - 420 K/uL    MPV 9.9 9.2 - 11.8 FL    Nucleated RBCs 0.0 0.0 PER 100 WBC    nRBC 0.00 0.00 - 0.01 K/uL    Seg Neutrophils 48 40 - 73 %     Lymphocytes 39 21 - 52 %    Monocytes 12 (H) 3 - 10 %    Eosinophils % 0 0 - 5 %    Basophils 1 0 - 2 %    Immature Granulocytes 0 0 - 0.5 %    Segs Absolute 3.0 1.8 - 8.0 K/UL    Absolute Lymph # 2.5 0.9 - 3.6 K/UL    Absolute Mono # 0.8 0.05 - 1.2 K/UL    Absolute Eos # 0.0 0.0 - 0.4 K/UL    Basophils Absolute 0.0 0.0 - 0.1 K/UL    Absolute Immature Granulocyte 0.0 0.00 - 0.04 K/UL    Differential Type AUTOMATED     CMP    Collection Time: 08/07/21  5:37 PM   Result Value Ref Range    Sodium 141 136 - 145 mmol/L    Potassium 3.1 (L) 3.5 - 5.5 mmol/L    Chloride 108 100 - 111 mmol/L    CO2 27 21 - 32 mmol/L    Anion Gap 6 3.0 - 18.0 mmol/L    Glucose 71 (L) 74 - 99 mg/dL    BUN 7 7 - 18 mg/dL    Creatinine 1.04 0.60 - 1.30 mg/dL    Bun/Cre Ratio 7 (L) 12 - 20      Est, Glom Filt Rate >60 >60 ml/min/1.54m    Calcium 9.0 8.5 - 10.1 mg/dL    Total Bilirubin 1.3 (H) 0.2 - 1.0 mg/dL    AST 20 10 - 38 U/L    ALT 38 16 - 61 U/L    Alk Phosphatase 63 45 - 117 U/L    Total Protein 7.5 6.4 - 8.2 g/dL    Albumin 3.2 (L) 3.4 - 5.0 g/dL    Globulin 4.3 (H) 2.0 - 4.0 g/dL    Albumin/Globulin Ratio 0.7 (L) 0.8 - 1.7     Lipase    Collection Time: 08/07/21  5:37 PM   Result Value Ref Range    Lipase 142 73 - 393 U/L   Lactic Acid    Collection Time: 08/07/21  5:37 PM   Result Value Ref Range    Lactic Acid, Plasma 1.6 0.4 - 2.0 mmol/L  Imaging:  '@[email or web address]' @     Assessment & Plan:     #1: Foreign Body in Abdomen; Large and Small Bowel Obstruction:  -admit to inpatient, medical unit.  -General surgery Dr Brigid Re consulted in the ED, with recommendations to keep patient NPO, NG tube insertion, and IV fluids.  -CT abdomen/pelvis with contrast today shows:  There are 2 cylindrical radiopaque foreign bodies, likely batteries, within a   distended loop of colon in the right mid abdomen, which is immediately proximal   to a right mid abdominal colostomy. The associated loop of colon is distended,   up to 7 cm. No evidence of  perforation.   2. Mild small bowel dilatation in the right lower quadrant, up to 3.9 cm, which   may be due to a surgical adhesion.   3. No evidence of bowel perforation.     -Hx of RLQ Ileostomy since age 51 months.   -K level is 3.1, -cont d51/2NS+20Kcl'@100ml' /hr.          VTE prophylaxis: Lovenox Sq  Code status: Full code  Total time spent: 45 minutes  Plan of care discussed with patient, nursing staff.

## 2021-08-07 NOTE — ED Provider Notes (Signed)
[facility] [address]  EMERGENCY DEPARTMENT HISTORY AND PHYSICAL EXAM        Date: 08/07/2021  Patient Name: Patrick Villegas  MRN: [id number]  Birthdate 01-24-1991  Date of evaluation: 08/07/2021  Provider: Tyrell Antonio, MD   Note Started: 1:52 PM 08/07/21    HISTORY OF PRESENT ILLNESS     Chief Complaint   Patient presents with    Abdominal Pain    Abdominal Injury    Foreign Body       History Provided By: Patient    HPI: Patrick Villegas, 31 y.o. male   PMHx Hirschsprung's presents with abdominal pain. State she has an ileostomy since infancy. He noted little coming out over the past two weeks so he started putting different things in the ostomy tring to relieve what he thought was SBO. He tried an enema which flushed straight back out then he tried a stick but got resistance. He then tied three AA batteries to the stick to try to push through the perceived obstruction. He had a sudden give and developed immediate severe abdominal pain. This occurred 2 hours ago. He states the stick broke off leaving the batteries behind. He last ate at 7 am and vomited after eating.          PAST MEDICAL HISTORY   Past Medical History:  Past Medical History:   Diagnosis Date    Congenital megacolon     Hirschsprung disease       Past Surgical History:  Past Surgical History:   Procedure Laterality Date    ABDOMEN SURGERY      COLOSTOMY         Family History:  Family History   Problem Relation Age of Onset    No Known Problems Mother     No Known Problems Father     No Known Problems Sister     No Known Problems Brother     No Known Problems Maternal Aunt     No Known Problems Maternal Uncle     No Known Problems Paternal Aunt     No Known Problems Paternal Uncle     No Known Problems Maternal Grandmother     No Known Problems Maternal Grandfather     No Known Problems Paternal Grandmother     No Known Problems Paternal Grandfather     No Known Problems Maternal Cousin     No Known Problems Paternal Cousin     No Known Problems Other        Social  History:  Social History     Tobacco Use    Smoking status: Never     Passive exposure: Never    Smokeless tobacco: Never   Vaping Use    Vaping Use: Never used   Substance Use Topics    Alcohol use: Not Currently    Drug use: Never       Allergies:  Allergies   Allergen Reactions    Banana Anaphylaxis and Hives     Or Banana flavoring    Watermelon Flavor Anaphylaxis and Hives       PCP: None None    Current Meds:   Discharge Medication List as of 08/10/2021 11:51 AM        CONTINUE these medications which have NOT CHANGED    Details   cyanocobalamin 1000 MCG/ML injection Inject 1,000 mcg into the muscle onceHistorical Med             REVIEW OF SYSTEMS  Review of Systems   Constitutional: Negative.    HENT: Negative.     Eyes: Negative.    Respiratory: Negative.     Cardiovascular: Negative.    Gastrointestinal:  Positive for abdominal pain.   Genitourinary: Negative.    Musculoskeletal: Negative.    Neurological: Negative.    All other systems reviewed and are negative.  Positives and Pertinent negatives as per HPI.    PHYSICAL EXAM     Vitals:    08/09/21 2336 08/10/21 0248 08/10/21 0730 08/10/21 1115   BP: 106/73  125/77 (!) 135/91   Pulse: 61  72 64   Resp: 16 18 18 18    Temp: 97.8 ??F (36.6 ??C)  97.4 ??F (36.3 ??C) 97.5 ??F (36.4 ??C)   TempSrc: Temporal  Temporal    SpO2: 97%  100% 99%   Weight:       Height:         Physical Exam  Constitutional:       Appearance: He is well-developed.   Cardiovascular:      Rate and Rhythm: Normal rate and regular rhythm.   Pulmonary:      Effort: Pulmonary effort is normal.      Breath sounds: Normal breath sounds.   Abdominal:      General: Abdomen is scaphoid. Bowel sounds are increased.      Tenderness: There is generalized abdominal tenderness. There is guarding.      Hernia: No hernia is present.      Comments: Ostomy bag with liquid brown stool in it.   Neurological:      General: No focal deficit present.      Mental Status: He is alert and oriented to person, place, and  time.       SCREENINGS                No data recorded     LAB, EKG AND DIAGNOSTIC RESULTS   Labs:  No results found for this or any previous visit (from the past 12 hour(s)).          Radiologic Studies:  Non-plain film images such as CT, Ultrasound and MRI are read by the radiologist. Plain radiographic images are visualized and preliminarily interpreted by the ED Provider with the below findings:    *    Interpretation per the Radiologist below, if available at the time of this note:  No results found.    PROCEDURES   Unless otherwise noted below, none  Performed by: Tyrell AntonioEniola Delawrence Fridman, MD   Procedures      CRITICAL CARE TIME   CRITICAL CARE NOTE :  IMPENDING DETERIORATION -Cardiovascular and Metabolic  ASSOCIATED RISK FACTORS - Hypotension, Shock, Metabolic changes, and Dehydration  MANAGEMENT- Bedside Assessment  INTERPRETATION -  Xrays and Blood Pressure  INTERVENTIONS - hemodynamic mngmt and Metobolic interventions  CASE REVIEW - Hospitalist and Medical Sub-Specialist  TREATMENT RESPONSE -Improved  PERFORMED BY - Self    NOTES   :  I have spent 60 minutes of critical care time involved in lab review, consultations with specialist, family decision- making, bedside attention and documentation. This time excludes time spent in any separate billed procedures.  During this entire length of time I was immediately available to the patient .    Tyrell AntonioEniola Lynnelle Mesmer, MD     EMERGENCY DEPARTMENT COURSE and DIFFERENTIAL DIAGNOSIS/MDM   Vitals:    Vitals:    08/09/21 2336 08/10/21 0248 08/10/21 0730 08/10/21 1115   BP: 106/73  125/77 (!) 135/91   Pulse: 61  72 64   Resp: 16 18 18 18    Temp: 97.8 ??F (36.6 ??C)  97.4 ??F (36.3 ??C) 97.5 ??F (36.4 ??C)   TempSrc: Temporal  Temporal    SpO2: 97%  100% 99%   Weight:       Height:             Patient was given the following medications:  Medications   potassium chloride 10 mEq/100 mL IVPB (Peripheral Line) (0 mEq IntraVENous Stopped 08/08/21 1115)   morphine (PF) injection 2 mg (2 mg  IntraVENous Given 08/07/21 1918)   ondansetron (ZOFRAN) injection 4 mg (4 mg IntraVENous Given 08/07/21 1758)   iopamidol (ISOVUE-370) 76 % injection 90 mL (90 mLs IntraVENous Given 08/07/21 1934)   HYDROmorphone (DILAUDID) injection 0.5 mg (0.5 mg IntraVENous Given 08/07/21 2027)   morphine (PF) injection 2 mg (2 mg IntraVENous Given 08/07/21 2133)   HYDROmorphone (DILAUDID) injection 1 mg (1 mg IntraVENous Given 08/07/21 2343)   potassium chloride 10 mEq/100 mL IVPB (Peripheral Line) (0 mEq IntraVENous Stopped 08/09/21 0936)       CONSULTS: (Who and What was discussed)  Dr Darrel Hoover Surgeon requested Oral and IV CT abd and pelvis. He will see.    Chronic Conditions: Ostomy bag  Social Determinants affecting Dx or Tx:   Counseling:  None    Records Reviewed (source and summary of external notes): Nursing Notes    MDM: Endorsed to Dr Laural Benes pending labs and CT to call Dr Leana Gamer.  Patient who has PMHx of ileostomy since infancy secondary to Hirschprung's disease.      He presented with acute severe abdominal pain after he deliberately shoved a stick with three AA batteries taped to the end into his ileostomy. The tape loosened and he lost two AA batteries which he could not retrieve from the ostomy. DDx include FB in ileum, bowel perforation, Battery generated acidic necrosis of ileum, peritonitis. PE revealed a young man in a lot of pain with diffuse abdominal tenderness. KUB conformed presence of two AA batteries. Lab work and CT scans were performed and surgery Dr Darrel Hoover consulted. Patient felt batter after IV Morphine. Dr Darrel Hoover the surgeon planned to see the patient and he was admitted to the Hospitalist.      Disposition Considerations: Needs surgical intervention     ED FINAL IMPRESSION     1. Generalized abdominal pain    2. Foreign body ingestion, initial encounter    3. Status post ileostomy [facility])          DISPOSITION/PLAN   DISPOSITION Admitted 08/07/2021 08:37:00 PM      Admit to surgical floor    Admit Note: Patient is  being admitted by [facility] MD. The results of their tests and reasons for their admission have been discussed with patient and/or family. They convey agreement and understanding for the need to be admitted and for admission diagnosis.        DISCHARGE MEDICATIONS:     Medication List        ASK your doctor about these medications      cyanocobalamin 1000 MCG/ML injection                DISCONTINUED MEDICATIONS:  Discharge Medication List as of 08/10/2021 11:51 AM          I am the Primary Clinician of Record. Tyrell Antonio, MD (electronically signed)    (Please note that parts of this dictation  were completed with voice recognition software. Quite often unanticipated grammatical, syntax, homophones, and other interpretive errors are inadvertently transcribed by the computer software. Please disregards these errors. Please excuse any errors that have escaped final proofreading.)     Ninetta Lights, MD  08/12/21 1352

## 2021-08-07 NOTE — ED Notes (Signed)
Patient reports increased pain states Morphine has not helped. Dr. Karena Addison made aware.      Arley Phenix, RN  08/07/21 1945

## 2021-08-07 NOTE — ED Notes (Signed)
8:37 PM rec'd patient in sign out pending ct.  Ct results noted, read to dr Darrel Hoover, req admit to hospitalist w ngt  D/w NP Olu, to admit     Darryl Lent, MD  08/07/21 2038

## 2021-08-07 NOTE — ED Notes (Signed)
TRANSFER - OUT REPORT:    Verbal report given to Marchelle Folks  on Patrick Villegas  being transferred to [address] [place]  for routine progression of patient care       Report consisted of patient's Situation, Background, Assessment and   Recommendations(SBAR).     Information from the following report(s) Nurse Handoff Report, ED Encounter Summary, ED SBAR, MAR, Recent Results, and Neuro Assessment was reviewed with the receiving nurse.  Kinder Assessment: Presents to emergency department  because of falls (Syncope, seizure, or loss of consciousness): No, Age > 70: No, Altered Mental Status, Intoxication with alcohol or substance confusion (Disorientation, impaired judgment, poor safety awaremess, or inability to follow instructions): No, Impaired Mobility: Ambulates or transfers with assistive devices or assistance; Unable to ambulate or transer.: No, Nursing Judgement: No  Lines:   Peripheral IV Left;Ventral Hand (Active)        Opportunity for questions and clarification was provided.      Patient transported with:  Ranae Palms, RN  08/07/21 2214

## 2021-08-07 NOTE — ED Triage Notes (Signed)
Per EMS the pt has been unable to pass stool through his colostomy for about 2 to 3 weeks, pt states what comes out is diarrhea type stool. Pt took 3 batteries taped them together to get him to move his bowels, lost the batteries in his ostomy site, k=now is having terrible pain

## 2021-08-08 ENCOUNTER — Observation Stay: Admit: 2021-08-08 | Payer: PRIVATE HEALTH INSURANCE

## 2021-08-08 LAB — CBC WITH AUTO DIFFERENTIAL
Absolute Immature Granulocyte: 0 10*3/uL (ref 0.00–0.04)
Basophils %: 1 % (ref 0–2)
Basophils Absolute: 0 10*3/uL (ref 0.0–0.1)
Eosinophils %: 1 % (ref 0–5)
Eosinophils Absolute: 0.1 10*3/uL (ref 0.0–0.4)
Hematocrit: 46.3 % (ref 36.0–48.0)
Hemoglobin: 15.6 g/dL (ref 13.0–16.0)
Immature Granulocytes: 0 % (ref 0–0.5)
Lymphocytes %: 36 % (ref 21–52)
Lymphocytes Absolute: 2.2 10*3/uL (ref 0.9–3.6)
MCH: 30.4 PG (ref 24.0–34.0)
MCHC: 33.7 g/dL (ref 31.0–37.0)
MCV: 90.1 FL (ref 78.0–100.0)
MPV: 9.9 FL (ref 9.2–11.8)
Monocytes %: 10 % (ref 3–10)
Monocytes Absolute: 0.6 10*3/uL (ref 0.05–1.2)
Neutrophils %: 52 % (ref 40–73)
Neutrophils Absolute: 3.1 10*3/uL (ref 1.8–8.0)
Nucleated RBCs: 0 PER 100 WBC
Platelets: 224 10*3/uL (ref 135–420)
RBC: 5.14 M/uL (ref 4.35–5.65)
RDW: 12.8 % (ref 11.6–14.5)
WBC: 5.9 10*3/uL (ref 4.6–13.2)
nRBC: 0 10*3/uL (ref 0.00–0.01)

## 2021-08-08 LAB — BASIC METABOLIC PANEL
Anion Gap: 6 mmol/L (ref 3.0–18.0)
BUN: 7 mg/dL (ref 7–18)
Bun/Cre Ratio: 7 — ABNORMAL LOW (ref 12–20)
CO2: 29 mmol/L (ref 21–32)
Calcium: 8.7 mg/dL (ref 8.5–10.1)
Chloride: 107 mmol/L (ref 100–111)
Creatinine: 0.98 mg/dL (ref 0.60–1.30)
Est, Glom Filt Rate: >60 mL/min/{1.73_m2} (ref >60)
Glucose: 105 mg/dL — ABNORMAL HIGH (ref 74–99)
Potassium: 3.1 mmol/L — ABNORMAL LOW (ref 3.5–5.5)
Sodium: 142 mmol/L (ref 136–145)

## 2021-08-08 MED ORDER — HYDROMORPHONE HCL 1 MG/ML IJ SOLN
1 MG/ML | Freq: Once | INTRAMUSCULAR | Status: AC
Start: 2021-08-08 — End: 2021-08-07
  Administered 2021-08-08: 05:00:00 1 mg via INTRAVENOUS

## 2021-08-08 MED ORDER — POTASSIUM CHLORIDE 10 MEQ/100ML IV SOLN
10 MEQ/0ML | INTRAVENOUS | Status: AC
Start: 2021-08-08 — End: 2021-08-08
  Administered 2021-08-08 (×3): 10 meq via INTRAVENOUS

## 2021-08-08 MED ORDER — PANTOPRAZOLE SODIUM 40 MG IV SOLR
40 MG | Freq: Every day | INTRAVENOUS | Status: DC
Start: 2021-08-08 — End: 2021-08-10
  Administered 2021-08-08 – 2021-08-10 (×3): 40 mg via INTRAVENOUS

## 2021-08-08 MED ORDER — KCL IN DEXTROSE-NACL 20-5-0.45 MEQ/L-%-% IV SOLN
20-5-0.45-- MEQ/L-%-% | INTRAVENOUS | Status: DC
Start: 2021-08-08 — End: 2021-08-10
  Administered 2021-08-08 – 2021-08-10 (×4): via INTRAVENOUS

## 2021-08-08 MED ORDER — HYDROMORPHONE HCL 1 MG/ML IJ SOLN
1 MG/ML | INTRAMUSCULAR | Status: DC | PRN
Start: 2021-08-08 — End: 2021-08-08
  Administered 2021-08-08 (×2): 0.5 mg via INTRAVENOUS

## 2021-08-08 MED ORDER — MORPHINE SULFATE (PF) 2 MG/ML IV SOLN
2 MG/ML | INTRAVENOUS | Status: AC
Start: 2021-08-08 — End: 2021-08-07
  Administered 2021-08-08: 03:00:00 2 mg via INTRAVENOUS

## 2021-08-08 MED ORDER — DIATRIZOATE MEGLUMINE & SODIUM 66-10 % PO SOLN
66-10 % | Freq: Once | ORAL | Status: DC | PRN
Start: 2021-08-08 — End: 2021-08-10
  Administered 2021-08-08: 01:00:00 5 mL via ORAL

## 2021-08-08 MED ORDER — IOPAMIDOL 76 % IV SOLN
76 % | Freq: Once | INTRAVENOUS | Status: AC | PRN
Start: 2021-08-08 — End: 2021-08-07
  Administered 2021-08-08: 01:00:00 90 mL via INTRAVENOUS

## 2021-08-08 MED ORDER — MORPHINE SULFATE 4 MG/ML IJ SOLN
4 MG/ML | INTRAMUSCULAR | Status: DC | PRN
Start: 2021-08-08 — End: 2021-08-07

## 2021-08-08 MED ORDER — HYDROMORPHONE HCL 1 MG/ML IJ SOLN
1 MG/ML | INTRAMUSCULAR | Status: AC
Start: 2021-08-08 — End: 2021-08-07
  Administered 2021-08-08: 01:00:00 0.5 mg via INTRAVENOUS

## 2021-08-08 MED ORDER — HYDROMORPHONE HCL 1 MG/ML IJ SOLN
1 MG/ML | INTRAMUSCULAR | Status: DC | PRN
Start: 2021-08-08 — End: 2021-08-09
  Administered 2021-08-08 – 2021-08-09 (×8): 1 mg via INTRAVENOUS

## 2021-08-08 MED ORDER — POTASSIUM CHLORIDE 10 MEQ/100ML IV SOLN
10 MEQ/0ML | Freq: Once | INTRAVENOUS | Status: AC
Start: 2021-08-08 — End: 2021-08-09
  Administered 2021-08-08: 16:00:00 10 meq via INTRAVENOUS

## 2021-08-08 MED FILL — POTASSIUM CHLORIDE 10 MEQ/100ML IV SOLN: 10 MEQ/0ML | INTRAVENOUS | Qty: 100

## 2021-08-08 MED FILL — HYDROMORPHONE HCL 1 MG/ML IJ SOLN: 1 MG/ML | INTRAMUSCULAR | Qty: 1

## 2021-08-08 MED FILL — GASTROGRAFIN 66-10 % PO SOLN: 66-10 % | ORAL | Qty: 5

## 2021-08-08 MED FILL — KCL IN DEXTROSE-NACL 20-5-0.45 MEQ/L-%-% IV SOLN: INTRAVENOUS | Qty: 1000

## 2021-08-08 MED FILL — MORPHINE SULFATE (PF) 2 MG/ML IV SOLN: 2 mg/mL | INTRAVENOUS | Qty: 1

## 2021-08-08 MED FILL — ISOVUE-370 76 % IV SOLN: 76 % | INTRAVENOUS | Qty: 90

## 2021-08-08 MED FILL — PROTONIX 40 MG IV SOLR: 40 MG | INTRAVENOUS | Qty: 40

## 2021-08-08 NOTE — Progress Notes (Addendum)
2230 - Pt transferred to [address], room 247. Pt c/o 10/10 pain. Pt educated about NG tube, pt continues to refuse. Pt refuses to take PRN Morphine, stating it does not work for his pain.     2245 - MD notified and placed one time order for 1mg  Dilaudid and for further pain management ordered 0.5mg  Dilaudid q3 hours PRN.     2343 - One time dose dilaudid administered with continuous fluids started. Pt tolerated well.     0250 - Pt c/o 10/10 pain in abd area. PRN Dilaudid administered.

## 2021-08-08 NOTE — Consults (Signed)
Consult Date: 08/08/2021    Consults-year-old male who presented to the hospital after inserting some AA batteries through his colostomy site due to decreased output over the last several weeks he reported.  Patient underwent CT scan of the abdomen pelvis which was fully contrasted which did not demonstrate any evidence of perforation 2 AA batteries were in place.  There was question about some possible early small bowel obstruction.  Patient had refused NG placement and decompression understanding that it could be life-threatening by refusal.  This morning the patient is totally asymptomatic he denies any abdominal complaints.  Denies any nausea vomiting or colicky abdominal pain.  His ostomy by visualization has succus in it.  Abdominal films this morning appear to be unremarkable by my initial read the final radiology read is not available.    Subjective     Past Medical History:   Diagnosis Date    Congenital megacolon     Hirschsprung disease      Past Surgical History:   Procedure Laterality Date    ABDOMEN SURGERY      COLOSTOMY       Family History   Problem Relation Age of Onset    No Known Problems Mother     No Known Problems Father     No Known Problems Sister     No Known Problems Brother     No Known Problems Maternal Aunt     No Known Problems Maternal Uncle     No Known Problems Paternal Aunt     No Known Problems Paternal Uncle     No Known Problems Maternal Grandmother     No Known Problems Maternal Grandfather     No Known Problems Paternal Grandmother     No Known Problems Paternal Grandfather     No Known Problems Maternal Cousin     No Known Problems Paternal Cousin     No Known Problems Other       Social History     Tobacco Use    Smoking status: Never     Passive exposure: Never    Smokeless tobacco: Never   Substance Use Topics    Alcohol use: Not Currently       Allergies   Allergen Reactions    Banana Anaphylaxis and Hives     Or Banana flavoring    Watermelon Flavor Anaphylaxis and  Hives     Current Facility-Administered Medications   Medication Dose Route Frequency Provider Last Rate Last Admin    potassium chloride 10 mEq/100 mL IVPB (Peripheral Line)  10 mEq IntraVENous Q1H Mickie Kay, MD 100 mL/hr at 08/08/21 0856 10 mEq at 08/08/21 0856    HYDROmorphone (DILAUDID) injection 1 mg  1 mg IntraVENous Q3H PRN Mickie Kay, MD   1 mg at 08/08/21 0856    pantoprazole (PROTONIX) injection 40 mg  40 mg IntraVENous Daily Gracy Racer, MD        diatrizoate meglumine-sodium (GASTROGRAFIN) 66-10 % solution 5 mL  5 mL Oral ONCE PRN Lurline Idol, MD   5 mL at 08/07/21 1951    dextrose 5 % and 0.45 % NaCl with KCl 20 mEq infusion   IntraVENous Continuous Hardie Pulley, APRN - NP 100 mL/hr at 08/07/21 2343 New Bag at 08/07/21 2343        Review of Systems   All other systems reviewed and are negative.    Objective     Blood pressure 105/64, pulse 59, temperature (!)  96 ??F (35.6 ??C), temperature source Temporal, resp. rate 16, height '5\' 5"'  (1.651 m), weight 154 lb (69.9 kg), SpO2 98 %.    Intake/Output:  Current Shift: No intake/output data recorded.  Last 3 Shifts: No intake/output data recorded.    Data Review:   Recent Results (from the past 24 hour(s))   CBC with Diff    Collection Time: 08/07/21  5:37 PM   Result Value Ref Range    WBC 6.3 4.6 - 13.2 K/uL    RBC 5.28 4.35 - 5.65 M/uL    Hemoglobin 16.1 (H) 13.0 - 16.0 g/dL    Hematocrit 47.5 36.0 - 48.0 %    MCV 90.0 78.0 - 100.0 FL    MCH 30.5 24.0 - 34.0 PG    MCHC 33.9 31.0 - 37.0 g/dL    RDW 12.9 11.6 - 14.5 %    Platelets 265 135 - 420 K/uL    MPV 9.9 9.2 - 11.8 FL    Nucleated RBCs 0.0 0.0 PER 100 WBC    nRBC 0.00 0.00 - 0.01 K/uL    Seg Neutrophils 48 40 - 73 %    Lymphocytes 39 21 - 52 %    Monocytes 12 (H) 3 - 10 %    Eosinophils % 0 0 - 5 %    Basophils 1 0 - 2 %    Immature Granulocytes 0 0 - 0.5 %    Segs Absolute 3.0 1.8 - 8.0 K/UL    Absolute Lymph # 2.5 0.9 - 3.6 K/UL    Absolute Mono # 0.8 0.05 - 1.2 K/UL    Absolute  Eos # 0.0 0.0 - 0.4 K/UL    Basophils Absolute 0.0 0.0 - 0.1 K/UL    Absolute Immature Granulocyte 0.0 0.00 - 0.04 K/UL    Differential Type AUTOMATED     CMP    Collection Time: 08/07/21  5:37 PM   Result Value Ref Range    Sodium 141 136 - 145 mmol/L    Potassium 3.1 (L) 3.5 - 5.5 mmol/L    Chloride 108 100 - 111 mmol/L    CO2 27 21 - 32 mmol/L    Anion Gap 6 3.0 - 18.0 mmol/L    Glucose 71 (L) 74 - 99 mg/dL    BUN 7 7 - 18 mg/dL    Creatinine 1.04 0.60 - 1.30 mg/dL    Bun/Cre Ratio 7 (L) 12 - 20      Est, Glom Filt Rate >60 >60 ml/min/1.68m    Calcium 9.0 8.5 - 10.1 mg/dL    Total Bilirubin 1.3 (H) 0.2 - 1.0 mg/dL    AST 20 10 - 38 U/L    ALT 38 16 - 61 U/L    Alk Phosphatase 63 45 - 117 U/L    Total Protein 7.5 6.4 - 8.2 g/dL    Albumin 3.2 (L) 3.4 - 5.0 g/dL    Globulin 4.3 (H) 2.0 - 4.0 g/dL    Albumin/Globulin Ratio 0.7 (L) 0.8 - 1.7     Lipase    Collection Time: 08/07/21  5:37 PM   Result Value Ref Range    Lipase 142 73 - 393 U/L   Lactic Acid    Collection Time: 08/07/21  5:37 PM   Result Value Ref Range    Lactic Acid, Plasma 1.6 0.4 - 2.0 mmol/L   CBC with Auto Differential    Collection Time: 08/08/21  4:15 AM   Result Value Ref Range  WBC 5.9 4.6 - 13.2 K/uL    RBC 5.14 4.35 - 5.65 M/uL    Hemoglobin 15.6 13.0 - 16.0 g/dL    Hematocrit 46.3 36.0 - 48.0 %    MCV 90.1 78.0 - 100.0 FL    MCH 30.4 24.0 - 34.0 PG    MCHC 33.7 31.0 - 37.0 g/dL    RDW 12.8 11.6 - 14.5 %    Platelets 224 135 - 420 K/uL    MPV 9.9 9.2 - 11.8 FL    Nucleated RBCs 0.0 0.0 PER 100 WBC    nRBC 0.00 0.00 - 0.01 K/uL    Seg Neutrophils 52 40 - 73 %    Lymphocytes 36 21 - 52 %    Monocytes 10 3 - 10 %    Eosinophils % 1 0 - 5 %    Basophils 1 0 - 2 %    Immature Granulocytes 0 0 - 0.5 %    Segs Absolute 3.1 1.8 - 8.0 K/UL    Absolute Lymph # 2.2 0.9 - 3.6 K/UL    Absolute Mono # 0.6 0.05 - 1.2 K/UL    Absolute Eos # 0.1 0.0 - 0.4 K/UL    Basophils Absolute 0.0 0.0 - 0.1 K/UL    Absolute Immature Granulocyte 0.0 0.00 - 0.04 K/UL     Differential Type AUTOMATED     Basic Metabolic Panel    Collection Time: 08/08/21  4:15 AM   Result Value Ref Range    Sodium 142 136 - 145 mmol/L    Potassium 3.1 (L) 3.5 - 5.5 mmol/L    Chloride 107 100 - 111 mmol/L    CO2 29 21 - 32 mmol/L    Anion Gap 6 3.0 - 18.0 mmol/L    Glucose 105 (H) 74 - 99 mg/dL    BUN 7 7 - 18 mg/dL    Creatinine 0.98 0.60 - 1.30 mg/dL    Bun/Cre Ratio 7 (L) 12 - 20      Est, Glom Filt Rate >60 >60 ml/min/1.49m    Calcium 8.7 8.5 - 10.1 mg/dL       Physical Exam  Constitutional:       Appearance: Normal appearance.   HENT:      Head: Normocephalic.   Eyes:      Pupils: Pupils are equal, round, and reactive to light.   Cardiovascular:      Rate and Rhythm: Normal rate.      Pulses: Normal pulses.   Pulmonary:      Effort: Pulmonary effort is normal.   Abdominal:      General: Abdomen is flat. There is no distension.      Palpations: Abdomen is soft.      Tenderness: There is no abdominal tenderness.   Skin:     General: Skin is warm.   Neurological:      General: No focal deficit present.      Mental Status: He is alert.   Psychiatric:         Mood and Affect: Mood normal.   Ostomy is patent and functional with succus entericus in the bag.    Assessment: 31year old male with 2 batteries within his colostomy segment and no evidence of surgical bowel obstruction or perforation at this time.    Plan: Based on the appearance of the batteries and CT scan there is high likelihood they will pass spontaneously.  I have discussed with the patient his refusal of NG decompression  at this time could be life-threatening and he understands.  We would anticipate continuing n.p.o. status and strict maintenance of his urine and colostomy output.  Abdominal series ordered for the a.m.  I have discussed this with the hospitalist.  Patient understands for the potential of endoscopic evaluation through the ostomy for possible bilateral battery removal if if they do not pass spontaneously.

## 2021-08-08 NOTE — Care Coordination-Inpatient (Signed)
Case Management Assessment  Initial Evaluation    Date/Time of Evaluation: 08/08/2021 10:29 AM  Assessment Completed by: Garlon Hatchet    If patient is discharged prior to next notation, then this note serves as note for discharge by case management.    Patient Name: Patrick Villegas                   Date of Birth: [birth year]  Diagnosis: Abdominal pain [R10.9]  Generalized abdominal pain [R10.84]  Foreign body ingestion, initial encounter [T18.9XXA]                   Date / Time: 08/07/2021  4:30 PM    Patient Admission Status: Observation   Readmission Risk (Low < 19, Mod (19-27), High > 27): No data recorded  Current PCP: None None  PCP verified by CM?      Chart Reviewed: Yes      History Provided by: Patient  Patient Orientation: Alert and Oriented    Patient Cognition: Alert    Hospitalization in the last 30 days (Readmission):  No    If yes, Readmission Assessment in CM Navigator will be completed.    Advance Directives:      Code Status: Full Code   Patient's Primary Decision Maker is:        Discharge Planning:    Patient lives with: Family Members Type of Home: House  Primary Care Giver: Self  Patient Support Systems include: Friends/Neighbors   Current Financial resources:    Current community resources:    Current services prior to admission: None            Current DME:              Type of Home Care services:  None    ADLS  Prior functional level: Independent in ADLs/IADLs  Current functional level: Independent in ADLs/IADLs    PT AM-PAC:   /24  OT AM-PAC:   /24    Family can provide assistance at DC:    Would you like Case Management to discuss the discharge plan with any other family members/significant others, and if so, who?    Plans to Return to Present Housing: Yes  Other Identified Issues/Barriers to RETURNING to current housing: yes  Potential Assistance needed at discharge: N/A            Potential DME:    Patient expects to discharge to: House  Plan for transportation at discharge:       Financial    Payor: [employer]vertising copy[employer] COMMUNITY PL / Plan: [facility] COMMUN[employer] PLAN OF [place] / Product Type: *No Product type* /     Does insurance require precert for SNF: No    Potential assistance Purchasing Medications: No  Meds-to-Beds request:        [id number] [place], [place] - [address] - P [phone] Carmon Ginsberg [phone]  [address]  [place] [address]  Phone: [phone] Fax: [phone]      Notes:    Factors facilitating achievement of predicted outcomes: Cooperative    Barriers to discharge: Medication managment    Additional Case Management Notes:    Patient lives at home and is independent, no DME or HH use or needs.  CM following for DC needs.    The Plan for Transition of Care is related to the following treatment goals of Abdominal pain [R10.9]  Generalized abdominal pain [R10.84]  Foreign body ingestion, initial encounter [T18.9XXA]  IF APPLICABLE: The Patient and/or patient representative Ananda and his family were provided with a choice of provider and agrees with the discharge plan. Freedom of choice list with basic dialogue that supports the patient's individualized plan of care/goals and shares the quality data associated with the providers was provided to:     Patient Representative Name:       The Patient and/or Patient Representative Agree with the Discharge Plan?      Garlon Hatchet  Case Management Department  Ph: [phone] Fax:

## 2021-08-08 NOTE — Progress Notes (Addendum)
0700- Assumed care of patient during bedside shift report. Patient resting in bed. Ivf infusing. CB in reach.     0830- patient down to Xray    0856- Prn pain medication administered for pain 10/10    1015- patient educated to notify staff of any ostomy output if he is preforming ostomy care. Patient verbalized understanding.     1300- Patient resting in bed no distress noted at this time.     1700- patient resting in bed on cell phone. Denies any current needs. CB in reach

## 2021-08-08 NOTE — Progress Notes (Signed)
Comprehensive Nutrition Assessment    Type and Reason for Visit:  Positive Nutrition Screen    Nutrition Recommendations/Plan:   NPO until foreign objects pass and ileostomy functioning  Then advance to regular diet   If NPO for greater than 6 days consider PN     Malnutrition Assessment:  Malnutrition Status:  No malnutrition (08/08/21 1851)    Context:  Acute Illness     Findings of the 6 clinical characteristics of malnutrition:  Energy Intake:  Mild decrease in energy intake (Comment) (NPO currently)   Weight Loss:  1% to 2% over 1 week (Usual weight 155-160 pounds)     Body Fat Loss:  No significant body fat loss     Muscle Mass Loss:  No significant muscle mass loss    Fluid Accumulation:  No significant fluid accumulation    Grip Strength:  Not Performed    Nutrition Assessment:    Pt admitted for possible SBO, 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  Pt states minimal  output from colostomy resulting in him putting batteries on a stick  and inserting into colostomy to try and remove  perceived obstruction.  Pt states intake reduced for the past few days, currently NPO with pt refusing NGT placement.  Last PO last evening resulting in N/V.    Nutrition Related Findings:    K- 3.1- requiring IVF repletion, Glucose 105 on D5NS with KCl at100 ml/hour.  Pt denies multivitamins, minerals, difficulty eating prior to current event.  Should pt remain NPO  greater than 7 days would consider PN. Wound Type: None       Current Nutrition Intake & Therapies:    Average Meal Intake: NPO  Average Supplements Intake: None Ordered  Diet NPO    Anthropometric Measures:  Height: 5\' 5"  (165.1 cm)  Ideal Body Weight (IBW): 136 lbs (62 kg)    Admission Body Weight: 154 lb 1.6 oz (69.9 kg)  Current Body Weight: 154 lb 1.6 oz (69.9 kg), 113.3 % IBW. Weight Source: Bed Scale  Current BMI (kg/m2): 25.6  Usual Body Weight: 160 lb (72.6 kg)  % Weight Change (Calculated): -3.7  Weight Adjustment For: No  Adjustment      BMI Categories: Overweight (BMI 25.0-29.9)    Estimated Daily Nutrient Needs:  Energy Requirements Based On: Kcal/kg  Weight Used for Energy Requirements: Current  Energy (kcal/day): 04-24-2001 Kcal/D (25-30 kcal/kg)     Protein (g/day): 56-70 gm/d  Method Used for Fluid Requirements: ml/Kg  Fluid (ml/day): 6812-7517 ml/d    Nutrition Diagnosis:   No nutrition diagnosis at this time     Nutrition Interventions:   Food and/or Nutrient Delivery: Continue NPO     Discharge Planning:    Too soon to determine     [id number]  Contact: [phone] or [employer]

## 2021-08-08 NOTE — Progress Notes (Signed)
Hospitalist Progress Note             Date of Service:  08/08/2021  NAME:  Patrick Villegas  DOB:  01-14-1991  MRN:  [id number]    Assessment & Plan:      #1: Foreign Body in Abdomen; Large and Small Bowel Obstruction:  -admit to inpatient, medical unit.  -General surgery Dr Darrel Hoover consulted in the ED, with recommendations to keep patient NPO,  and IV fluids.  -CT abdomen/pelvis with contrast today shows:  There are 2 cylindrical radiopaque foreign bodies, likely batteries, within a   distended loop of colon in the right mid abdomen, which is immediately proximal   to a right mid abdominal colostomy. The associated loop of colon is distended,   up to 7 cm. No evidence of perforation.   2. Mild small bowel dilatation in the right lower quadrant, up to 3.9 cm, which   may be due to a surgical adhesion.   3. No evidence of bowel perforation.      -Hx of RLQ Ileostomy since age 70 months.   - pain not controlled with morphine, best control with 1mg  iv dilaudid, no sedation or worrisome lethargy, will increase from 0.5mg  to 1mg  dose, q3h  - can add oral pain control when off of npo  - surgery to eval this am, they are aware of battery  - ngt was attempted, pt could not tolerate placement      Hypokalemia  - replace in ivf and iv           Review of Systems:   Pertinent items are noted in HPI.       Vital Signs:    Last 24hrs VS reviewed since prior progress note. Most recent are:  Vitals:    08/08/21 0707   BP:    Pulse:    Resp: 16   Temp:    SpO2:        No intake or output data in the 24 hours ending 08/08/21 0846     Physical Examination:             General:          Alert, cooperative, no distress, appears stated age.     HEENT:           Atraumatic, anicteric sclerae, pink conjunctivae                          No oral ulcers, mucosa moist, throat clear, dentition fair  Neck:               Supple, symmetrical  Lungs:             Clear  to auscultation bilaterally.  No Wheezing or Rhonchi. No rales.  Chest wall:      No tenderness  No Accessory muscle use.  Heart:              Regular  rhythm,  No  murmur   No edema  Abdomen:        very tender, no bs, colostmy  Extremities:     No cyanosis.  No clubbing,                            Skin turgor normal, Capillary refill normal  Skin:                Not pale.  Not Jaundiced  No rashes   Psych:             Not anxious or agitated.  Neurologic:      Alert, moves all extremities, answers questions appropriately and responds to commands        Data Review:    Review and/or order of clinical lab test  Review and/or order of tests in the radiology section of CPT  Review and/or order of tests in the medicine section of CPT      Labs:     Recent Labs     08/07/21  1737 08/08/21  0415   WBC 6.3 5.9   HGB 16.1* 15.6   HCT 47.5 46.3   PLT 265 224     Recent Labs     08/07/21  1737 08/08/21  0415   NA 141 142   K 3.1* 3.1*   CL 108 107   CO2 27 29   BUN 7 7     Recent Labs     08/07/21  1737   ALT 38   GLOB 4.3*     No results for input(s): INR, APTT in the last 72 hours.    Invalid input(s): PTP   No results for input(s): TIBC, FERR in the last 72 hours.    Invalid input(s): FE, PSAT   No results found for: FOL, RBCF   No results for input(s): PH, PCO2, PO2 in the last 72 hours.  No results for input(s): CPK in the last 72 hours.    Invalid input(s): CPKMB, CKNDX, TROIQ  No results found for: CHOL, CHOLX, CHLST, CHOLV, HDL, HDLC, LDL, LDLC, TGLX, TRIGL  No results found for: GLUCPOC  @LABUA @      Medications Reviewed:     Current Facility-Administered Medications   Medication Dose Route Frequency    potassium chloride 10 mEq/100 mL IVPB (Peripheral Line)  10 mEq IntraVENous Q1H    HYDROmorphone (DILAUDID) injection 1 mg  1 mg IntraVENous Q3H PRN    diatrizoate meglumine-sodium (GASTROGRAFIN) 66-10 % solution 5 mL  5 mL Oral ONCE PRN    dextrose 5 % and 0.45 % NaCl with KCl 20 mEq infusion   IntraVENous  Continuous     ______________________________________________________________________  EXPECTED LENGTH OF STAY: @EXPECTEDLOS @  ACTUAL LENGTH OF STAY:          0                 , MD

## 2021-08-09 ENCOUNTER — Observation Stay: Admit: 2021-08-09 | Payer: PRIVATE HEALTH INSURANCE

## 2021-08-09 LAB — CBC WITH AUTO DIFFERENTIAL
Absolute Immature Granulocyte: 0 10*3/uL (ref 0.00–0.04)
Basophils %: 1 % (ref 0–2)
Basophils Absolute: 0.1 10*3/uL (ref 0.0–0.1)
Eosinophils %: 1 % (ref 0–5)
Eosinophils Absolute: 0.1 10*3/uL (ref 0.0–0.4)
Hematocrit: 47.4 % (ref 36.0–48.0)
Hemoglobin: 15.4 g/dL (ref 13.0–16.0)
Immature Granulocytes: 0 % (ref 0–0.5)
Lymphocytes %: 29 % (ref 21–52)
Lymphocytes Absolute: 1.5 10*3/uL (ref 0.9–3.6)
MCH: 30.3 PG (ref 24.0–34.0)
MCHC: 32.5 g/dL (ref 31.0–37.0)
MCV: 93.1 FL (ref 78.0–100.0)
MPV: 11.7 FL (ref 9.2–11.8)
Monocytes %: 6 % (ref 3–10)
Monocytes Absolute: 0.3 10*3/uL (ref 0.05–1.2)
Neutrophils %: 63 % (ref 40–73)
Neutrophils Absolute: 3 10*3/uL (ref 1.8–8.0)
Nucleated RBCs: 0 PER 100 WBC
Platelets: 90 10*3/uL — ABNORMAL LOW (ref 135–420)
RBC: 5.09 M/uL (ref 4.35–5.65)
RDW: 12.8 % (ref 11.6–14.5)
WBC: 5 10*3/uL (ref 4.6–13.2)
nRBC: 0 10*3/uL (ref 0.00–0.01)

## 2021-08-09 LAB — COMPREHENSIVE METABOLIC PANEL
ALT: 36 U/L (ref 16–61)
AST: 38 U/L (ref 10–38)
Albumin/Globulin Ratio: 0.7 — ABNORMAL LOW (ref 0.8–1.7)
Albumin: 2.8 g/dL — ABNORMAL LOW (ref 3.4–5.0)
Alk Phosphatase: 52 U/L (ref 45–117)
Anion Gap: 5 mmol/L (ref 3.0–18.0)
BUN: 7 mg/dL (ref 7–18)
Bun/Cre Ratio: 7 — ABNORMAL LOW (ref 12–20)
CO2: 25 mmol/L (ref 21–32)
Calcium: 8.6 mg/dL (ref 8.5–10.1)
Chloride: 106 mmol/L (ref 100–111)
Creatinine: 0.97 mg/dL (ref 0.60–1.30)
Est, Glom Filt Rate: >60 mL/min/{1.73_m2} (ref >60)
Globulin: 3.9 g/dL (ref 2.0–4.0)
Glucose: 108 mg/dL — ABNORMAL HIGH (ref 74–99)
Potassium: 3.7 mmol/L (ref 3.5–5.5)
Sodium: 136 mmol/L (ref 136–145)
Total Bilirubin: 1.6 mg/dL — ABNORMAL HIGH (ref 0.2–1.0)
Total Protein: 6.7 g/dL (ref 6.4–8.2)

## 2021-08-09 LAB — PHOSPHORUS: Phosphorus: 1.8 mg/dL — ABNORMAL LOW (ref 2.5–4.9)

## 2021-08-09 LAB — MAGNESIUM: Magnesium: 1.7 mg/dL (ref 1.6–2.6)

## 2021-08-09 MED ORDER — HYDROMORPHONE HCL 1 MG/ML IJ SOLN
1 MG/ML | INTRAMUSCULAR | Status: DC | PRN
Start: 2021-08-09 — End: 2021-08-10
  Administered 2021-08-09 – 2021-08-10 (×7): 0.5 mg via INTRAVENOUS

## 2021-08-09 MED FILL — HYDROMORPHONE HCL 1 MG/ML IJ SOLN: 1 MG/ML | INTRAMUSCULAR | Qty: 1

## 2021-08-09 MED FILL — PROTONIX 40 MG IV SOLR: 40 MG | INTRAVENOUS | Qty: 40

## 2021-08-09 MED FILL — KCL IN DEXTROSE-NACL 20-5-0.45 MEQ/L-%-% IV SOLN: INTRAVENOUS | Qty: 1000

## 2021-08-09 NOTE — Progress Notes (Signed)
1138: spoke with GI specialist over the phone, per Dr. Ezzard Standing he don't feel comfortable consulting over the phone, but is willing to consult on the patient if he is transferred to [facility].     1335: Spoke with hospitalist at [facility] Dr. Janee Morn, who has accepted the patient. Patient is aware of the transfer, we are currently awaiting a bed, which they are anticipating discharges later today.

## 2021-08-09 NOTE — Progress Notes (Addendum)
0700- shift report received    0730- greeted patient. Patient requested linen change. Resting comfortably at this time    0935- shift assessment. Scheduled medication and pain medication administered for pain 8/10. Patient requested ostomy supplies for after shower. Still no output at this time.     1100- rounded on patient. Stated pain is still 8/10. Stated that he is very uncomfortable and there is still no output. He said he feels very compacted. He is waiting and wanting to talk to the surgeon.     1220- pain medication administered for 9/10 pain. Patient sitting on bed playing video game. Stated that he wanted to speak to the provider to talk about his plan of care.    1345- surgery spoke with patient about plans to transfer to [facility]. Patient to provider that he was in pain and  needed pain medication, but not time to medication at this time.     1730- patient getting ready for shower    1835- Patient ambulated in hall to nursing station and asked for pain medication for pain of 8/10. Medication administered. Patient asking about transfer to [facility] Turned out lights. Patient resting comfortably in bed

## 2021-08-09 NOTE — Progress Notes (Signed)
Progress Note  Date:08/09/2021       Room:247/01  Patient Name:Patrick Villegas     Date of Birth:Apr 08, 1991     Age:31 y.o.        Subjective    Subjective 31 y/o male with history of childhood ileostomy (according to patient, he reports it is not a colostomy and his entire colon was removed -- thought admittedly it is challenging to tell so on my review of his imaging). Pt reports he had had low output from his stoma over the last few weeks and he attempted to unblock with with a stick with batteries, that led to the dislodgment of two AA batteries. He states a previous such episode he was able to resolve on his own without issue. He denies nausea or emesis. He has significant abdominal pain requiring narcotics. Minimal air or output into the stoma per patient, where as usually he notices significant output.  Review of Systems   Constitutional:  Negative for fever.   All other systems reviewed and are negative.  Objective         Vitals Last 24 Hours:  TEMPERATURE:  Temp  Avg: 97.7 ??F (36.5 ??C)  Min: 96.7 ??F (35.9 ??C)  Max: 98.8 ??F (37.1 ??C)  RESPIRATIONS RANGE: Resp  Avg: 17.1  Min: 16  Max: 18  PULSE OXIMETRY RANGE: SpO2  Avg: 99.6 %  Min: 98 %  Max: 100 %  PULSE RANGE: Pulse  Avg: 68.2  Min: 56  Max: 79  BLOOD PRESSURE RANGE: Systolic (24hrs), Avg:115 , Min:111 , Max:122   ; Diastolic (24hrs), Avg:71, Min:66, Max:75    I/O (24Hr):    Intake/Output Summary (Last 24 hours) at 08/09/2021 1412  Last data filed at 08/09/2021 1251  Gross per 24 hour   Intake 2936.67 ml   Output 2330 ml   Net 606.67 ml     Objective:  General Appearance:  In no acute distress.    Vital signs: (most recent): Blood pressure 122/75, pulse 64, temperature 97 ??F (36.1 ??C), temperature source Temporal, resp. rate 18, height 5\' 5"  (1.651 m), weight 154 lb (69.9 kg), SpO2 100 %.  Vital signs are normal.  No fever.    Output: Producing urine and minimal stool output.    Lungs:  Normal effort and normal respiratory rate.  He is not in respiratory  distress.    Heart: Normal rate.    Abdomen: Abdomen is soft.  (Ileostomy with stool in bag that patient reports hasn't been changed in a while. Tender as ileostomy, would not allow digitization.  Multiple scars throughout abdomen).  There is generalized tenderness.     Extremities: Normal range of motion.    Neurological: Patient is alert and oriented to person, place and time.    Pupils:  Pupils are equal, round, and reactive to light.    Skin:  Warm.  No rash.   Labs/Imaging/Diagnostics    Labs:  CBC:  Recent Labs     08/07/21  1737 08/08/21  0415 08/09/21  0416   WBC 6.3 5.9 5.0   RBC 5.28 5.14 5.09   HGB 16.1* 15.6 15.4   HCT 47.5 46.3 47.4   MCV 90.0 90.1 93.1   RDW 12.9 12.8 12.8   PLT 265 224 90*     CHEMISTRIES:  Recent Labs     08/07/21  1737 08/08/21  0415 08/09/21  0416   NA 141 142 136   K 3.1* 3.1* 3.7   CL 108 107  106   CO2 27 29 25    BUN 7 7 7    CREATININE 1.04 0.98 0.97   GLUCOSE 71* 105* 108*   PHOS  --   --  1.8*   MG  --   --  1.7     PT/INR:No results for input(s): PROTIME, INR in the last 72 hours.  APTT:No results for input(s): APTT in the last 72 hours.  LIVER PROFILE:  Recent Labs     08/07/21  1737 08/09/21  0416   AST 20 38   ALT 38 36   BILITOT 1.3* 1.6*   ALKPHOS 63 52       Imaging Last 24 Hours:  XR ABDOMEN (KUB) (SINGLE AP VIEW)    Result Date: 08/07/2021  XR ABDOMEN (KUB) (SINGLE AP VIEW), 08/07/2021 5:16 PM INDICATION:  possible FB (batteries). Reason for exam:->possible FB (batteries) COMPARISON: None TECHNIQUE: Single view of the abdomen FINDINGS: There are 2 batteries projecting over the RIGHT mid abdomen likely in the distal aspect of the stomach. There is no bowel obstruction. There is no evidence of free air. No acute osseous abnormality is seen.     There are 2 AA or AAA type batteries projecting over the RIGHT aspect of the abdomen, likely in the distal aspect of the stomach.     XR ACUTE ABD SERIES CHEST 1 VW    Result Date: 08/09/2021  INDICATION:  foreign body Exam: AP view  of the chest, supine and upright frontal views of the abdomen. FINDINGS: Cardiomediastinal silhouette is within normal limits. Lungs are clear bilaterally. Pleural spaces are normal. Osseous structures are intact. There are two cylindrical radiodense foreign bodies, likely representing batteries, overlying the upper mid abdomen in the expected location of the stomach. No dilated loop of bowel or air-fluid level is seen. There is no evidence of free intraperitoneal gas. No pathologic calcification is seen. Osseous structures are intact.     Two cylindrical radiodense foreign bodies, likely representing batteries, overlying the upper mid abdomen in the expected location of the stomach.     CT ABDOMEN PELVIS W IV CONTRAST Additional Contrast? Oral    Result Date: 08/07/2021  EXAM: CT ABDOMEN PELVIS W IV CONTRAST INDICATION: FB in abdomen, Abd pain, possible peritonitis COMPARISON: None CONTRAST: 100 mL of Isovue-370. ORAL CONTRAST: Not given TECHNIQUE: Following the uneventful intravenous administration of contrast, thin axial images were obtained through the abdomen and pelvis. Coronal and sagittal reconstructions were generated. CT dose reduction was achieved through use of a standardized protocol tailored for this examination and automatic exposure control for dose modulation. FINDINGS: LOWER THORAX: No significant abnormality in the incidentally imaged lower chest. LIVER: Diffuse fatty infiltration of the liver. Focal hyperenhancing lesion in the periphery of the left lobe, measuring up to 16 mm, nonspecific. BILIARY TREE: Gallbladder is within normal limits. CBD is not dilated. SPLEEN: within normal limits. PANCREAS: No mass or ductal dilatation. ADRENALS: Unremarkable. KIDNEYS: No mass, calculus, or hydronephrosis. STOMACH: Unremarkable. SMALL BOWEL: There are 2 cylindrical radiopaque foreign bodies, likely batteries, present in the right midabdomen, within a dilated loop of large bowel, with an air-fluid level,  and this loop measures up to 7 cm. It is immediately proximal to a right mid abdominal colostomy. There is mild small bowel dilatation, up to 3.9 cm. COLON: See above. Also, evidence of partial colectomy APPENDIX: Not visualized PERITONEUM: No ascites or pneumoperitoneum. RETROPERITONEUM: No lymphadenopathy or aortic aneurysm. REPRODUCTIVE ORGANS: Prostate is noted URINARY BLADDER: No mass or calculus. BONES:  No destructive bone lesion. ABDOMINAL WALL: No mass or hernia. ADDITIONAL COMMENTS: N/A     1. There are 2 cylindrical radiopaque foreign bodies, likely batteries, within a distended loop of colon in the right mid abdomen, which is immediately proximal to a right mid abdominal colostomy. The associated loop of colon is distended, up to 7 cm. No evidence of perforation. 2. Mild small bowel dilatation in the right lower quadrant, up to 3.9 cm, which may be due to a surgical adhesion. 3. No evidence of bowel perforation.    XR ABDOMEN (2 VIEWS)    Result Date: 08/08/2021  EXAM:  XR ABDOMEN (2 VIEWS) INDICATION:   ABD pain COMPARISON: CT abdomen pelvis 08/07/2021, abdominal radiograph 08/07/2021. FINDINGS: 2 views of the abdomen were obtained. Interval migration of the 2 batteries within the abdomen, now within the lower abdomen in the midline. A right lower quadrant ostomy is again noted. There is contrast material noted in the bladder. There is no evidence of bowel obstruction. There is no free intraperitoneal air. No acute osseous abnormality.     1. Interval migration of 2 ingested batteries now within the lower midline abdomen, presumably within small bowel. No evidence of bowel obstruction or free intraperitoneal air.     Assessment//Plan           Hospital Problems             Last Modified POA    * (Principal) Abdominal pain 08/07/2021 Yes     Assessment & Plan    No current evidence of obstruction or perforation. GI consultation is not currently available. I advised consultation with appropriate GI service to  determine if the batteries should be removed urgently given the chemical nature of them, and patient's continued and persistent symptoms without output of the batteries. Patient has been accepted in transferred to [facility] for appropriate consultation to determined further interventions. His abdomen is potentially hostile given unknown childhood surgeries, and any surgical intervention could carry significant risks of intestinal injuries, thus I would not advocate for surgical intervention unless last resort after attempts at endoscopic interventions.    Cont NPO while awaiting transfer.  If patient remains, obtain upright abdominal x-ray in AM or at any event of significant clinical change.    Electronically signed by Youlanda Roys, MD on 08/09/21 at 2:12 PM EST

## 2021-08-09 NOTE — Progress Notes (Addendum)
1900-Assumed care of pt from off going nurse.    2137-PRN dilaudid given per request for pain 9/10 by pt. No other needs, call bell in reach.    2200-New bag of IVF hung, pt requested new colostomy bag so pt could apply it his self and was provided, no other needs voiced, call bell in reach.    0000-Pt sleeping during rounds, call bell in reach.    0218-Pt resting in bed, requested PRN dilaudid for abdomen pain, upon nurse giving med noted stool on blanket ask pt if pt changed bag states "no." Asked pt if he wanted to go ahead and sit up and get bag change pt states to nurse "I will change it later."    0530-Pt sleeping during rounds, call bell in reach.

## 2021-08-09 NOTE — Progress Notes (Signed)
Hospitalist Progress Note             Date of Service:  08/09/2021  NAME:  Patrick Villegas  DOB:  February 23, 1991  MRN:  [id number]    Assessment & Plan:      #1: Foreign Body in Abdomen; Large and Small Bowel Obstruction:  -admit to inpatient, medical unit.  -General surgery Dr Darrel Hoover consulted in the ED, with recommendations to keep patient NPO,  and IV fluids.  -CT abdomen/pelvis with contrast today shows:  There are 2 cylindrical radiopaque foreign bodies, likely batteries, within a   distended loop of colon in the right mid abdomen, which is immediately proximal   to a right mid abdominal colostomy. The associated loop of colon is distended,   up to 7 cm. No evidence of perforation.   2. Mild small bowel dilatation in the right lower quadrant, up to 3.9 cm, which   may be due to a surgical adhesion.   3. No evidence of bowel perforation.      -Hx of RLQ Ileostomy since age 30 months.   - decrease dilaudid to 0.5mg  q3h, change to percocet as soon an surgery lifts npo status  - can add oral pain control when off of npo  - surgery to eval this am, they are aware of battery  - ngt was attempted, pt could not tolerate placement        Hypokalemia  - resolved      Review of Systems:   Pertinent items are noted in HPI.     Dilaudid very helpful with pain control  Low outpt from ostomy      Vital Signs:    Last 24hrs VS reviewed since prior progress note. Most recent are:  Vitals:    08/09/21 0750   BP: 111/73   Pulse: 71   Resp: 18   Temp: 97 ??F (36.1 ??C)   SpO2: 100%         Intake/Output Summary (Last 24 hours) at 08/09/2021 0913  Last data filed at 08/09/2021 0741  Gross per 24 hour   Intake 2936.67 ml   Output 2480 ml   Net 456.67 ml        Physical Examination:             General:          Alert, cooperative, no distress, appears stated age.     HEENT:           Atraumatic, anicteric sclerae, pink conjunctivae                          No  oral ulcers, mucosa moist, throat clear, dentition fair  Neck:               Supple, symmetrical  Lungs:             Clear to auscultation bilaterally.  No Wheezing or Rhonchi. No rales.  Chest wall:      No tenderness  No Accessory muscle use.  Heart:              Regular  rhythm,  No  murmur   No edema  Abdomen:       much less tender today, flat,   Extremities:     No cyanosis.  No clubbing,  Skin turgor normal, Capillary refill normal  Skin:                Not pale.  Not Jaundiced  No rashes   Psych:             Not anxious or agitated.  Neurologic:      Alert, moves all extremities, answers questions appropriately and responds to commands        Data Review:    Review and/or order of clinical lab test  Review and/or order of tests in the radiology section of CPT  Review and/or order of tests in the medicine section of CPT      Labs:     Recent Labs     08/08/21  0415 08/09/21  0416   WBC 5.9 5.0   HGB 15.6 15.4   HCT 46.3 47.4   PLT 224 90*     Recent Labs     08/07/21  1737 08/08/21  0415 08/09/21  0416   NA 141 142 136   K 3.1* 3.1* 3.7   CL 108 107 106   CO2 27 29 25    BUN 7 7 7    MG  --   --  1.7   PHOS  --   --  1.8*     Recent Labs     08/07/21  1737 08/09/21  0416   ALT 38 36   GLOB 4.3* 3.9     No results for input(s): INR, APTT in the last 72 hours.    Invalid input(s): PTP   No results for input(s): TIBC, FERR in the last 72 hours.    Invalid input(s): FE, PSAT   No results found for: FOL, RBCF   No results for input(s): PH, PCO2, PO2 in the last 72 hours.  No results for input(s): CPK in the last 72 hours.    Invalid input(s): CPKMB, CKNDX, TROIQ  No results found for: CHOL, CHOLX, CHLST, CHOLV, HDL, HDLC, LDL, LDLC, TGLX, TRIGL  No results found for: GLUCPOC  @LABUA @      Medications Reviewed:     Current Facility-Administered Medications   Medication Dose Route Frequency    HYDROmorphone (DILAUDID) injection 0.5 mg  0.5 mg IntraVENous Q3H PRN    pantoprazole (PROTONIX)  injection 40 mg  40 mg IntraVENous Daily    diatrizoate meglumine-sodium (GASTROGRAFIN) 66-10 % solution 5 mL  5 mL Oral ONCE PRN    dextrose 5 % and 0.45 % NaCl with KCl 20 mEq infusion   IntraVENous Continuous     ______________________________________________________________________  EXPECTED LENGTH OF STAY: @EXPECTEDLOS @  ACTUAL LENGTH OF STAY:          0                 08/09/21, MD

## 2021-08-09 NOTE — Progress Notes (Addendum)
2040 - Pt resting in bed, C/o 10/10 pain in abd. PRN Dilaudid administered. Pt stated he had turned off IV pump, IV fluids reset and restarted. Pt has two remaining k-ridder's from dayshift to be infused, K-ridder infusing at 20mL/hr, pt states that's all he can handle. Pt denies any report of any output from ostomy. No other needs expressed at this time. CBWR.     0012 - PRN Dilaudid administered through left hand IV. Writer noted swelling around upper FA IV with continuous fluids infusing with potassium. Canna PA notified of infiltration, IV removed, and nursing supervisor notified to attempt to place new line.     0200 - Nursing supervisor was able to obtain two PIV's. Pt tolerated well. Cont. IV fluids restarted.     0300 - Pt used CB to request PRN pain medication, PRN Dilaudid and new bag cont fluids started. Pt stated he was going to go to sleep now. CBWR.     0430 - Pt stated to phlebotomist that he has been pausing the potassium containing IV chamber when a burning sensation starts in his IV, then restarts it when he feels like it. Writer notified both nursing supervisor and hospitalist. No new orders received but nursing supervisor educated about locking the IV pump so pt is unable to interfere with infusing medications.

## 2021-08-10 ENCOUNTER — Observation Stay: Admit: 2021-08-10 | Payer: PRIVATE HEALTH INSURANCE

## 2021-08-10 MED ORDER — OXYCODONE-ACETAMINOPHEN 5-325 MG PO TABS
5-325 MG | Freq: Four times a day (QID) | ORAL | Status: DC | PRN
Start: 2021-08-10 — End: 2021-08-10
  Administered 2021-08-10: 16:00:00 1 via ORAL

## 2021-08-10 MED FILL — PROTONIX 40 MG IV SOLR: 40 MG | INTRAVENOUS | Qty: 40

## 2021-08-10 MED FILL — HYDROMORPHONE HCL 1 MG/ML IJ SOLN: 1 MG/ML | INTRAMUSCULAR | Qty: 1

## 2021-08-10 MED FILL — OXYCODONE-ACETAMINOPHEN 5-325 MG PO TABS: 5-325 MG | ORAL | Qty: 1

## 2021-08-10 MED FILL — KCL IN DEXTROSE-NACL 20-5-0.45 MEQ/L-%-% IV SOLN: INTRAVENOUS | Qty: 1000

## 2021-08-10 NOTE — Progress Notes (Addendum)
0700- shift report received    0715- patient called the nursing station to ask for pain medication 9/10 pain.     7408- shift assessment and pain medication administered. Patient resting in bed. Patient not wearing an ostomy bag, requested another. Patient did not like the bag that the night shift provided. Patient denies output, but also stated that the bag from yesterday was leaking and there was stool on washcloths and on his sheet.     0800- Dr. Herma Carson Rounded and informed patient that he would be transferred to [facility] today if batteries did not come out on their own.     1448- scheduled protonix administered. Patient asked if it was time for pain medication again. Patient playing video games and resting in bed.    1045- Dr Herma Carson reviewed Xray and determined that the batteries are no longer present. Dr. Herma Carson spoke with patient. Patient denied seeing batteries in bag and still denies output. Patient changed 2 ostomy bags because they were leaking, but did not notice batteries.     1050- Patient called to nursing station to ask for pain medication. Waiting for pharmacy to verify new orders.     1108- Oral pain medication administered for 8/10 pain.    1115- patient ambulating in hall to the vending machine to buy candy.

## 2021-08-10 NOTE — Progress Notes (Addendum)
Patient eloped. Self removed ivs. NP arrington and nursing supervisor aware. Reason for eloping unclear.

## 2021-08-10 NOTE — Discharge Summary (Addendum)
AMA Discharge Summary       PATIENT ID: Patrick Villegas  MRN: [id number]   DATE OF BIRTH: June 04, 1991    DATE OF ADMISSION: 08/07/2021  4:30 PM    DATE OF DISCHARGE: 08/10/21    PRIMARY CARE PROVIDER: None None     ATTENDING PHYSICIAN: Mickie Kay, MD  DISCHARGING PROVIDER: Mickie Kay, MD        CONSULTATIONS: IP CONSULT TO GENERAL SURGERY    PROCEDURES/SURGERIES: * No surgery found *    ADMITTING [facility] COURSE:   Patrick Villegas is a 31 y.o. male with a past medical history significant for Hirschsprung disease, and chronic RLQ ileostomy who presents to the ED today with complaints of abdominal pain.  History is obtained from the patient in the ED. patient complains of abdominal pain in the last 2 weeks with intermittent nausea and vomiting, without any particular relieving or exacerbating factors.  He states "everything I eat just come back right out", he states he noted that his ostomy output has decreased in quantity and he suspected he may have some obstruction so today decided to figure out if he had some obstruction.  He took a stick and put it through his stoma site and he met some resistance he got the stick out, he had 3 AA batteries taped together and inserted through the stoma site unfortunately the tape loosened off and 2 batteries were not able to be retrieved from his stoma.  This prompted the ED visit.  At the time of my evaluation in the ED, he is grimacing his face and pain, noted with guarding and complaints of diffuse abdominal tenderness.  CT of the abdomen/pelvis shows   There are 2 cylindrical radiopaque foreign bodies, likely batteries, within a   distended loop of colon in the right mid abdomen, which is immediately proximal   to a right mid abdominal colostomy. The associated loop of colon is distended,   up to 7 cm. No evidence of perforation.   2. Mild small bowel dilatation in the right lower quadrant, up to 3.9 cm, which   may be due to a surgical adhesion.   3. No evidence  of bowel perforation.      After speaking with general surgery and made him aware that the abdominal series performed today did not demonstrated the foreign bodies, he states that as long as the patient is tolerating a diet he is stable for discharge,but will need to follow-up with GI specialist.    Notified by the nursing staff that patient had removed his IV and decided that he was leaving prior to lunch coming, and patient left the unit before talking with this writer and did not sign his AMA papers.         DISCHARGE DIAGNOSES / PLAN:         Assessment & Plan:      Foreign Body in Abdomen; Large and Small Bowel Obstruction:  -admit to inpatient, medical unit.  -General surgery Dr Brigid Re consulted in the ED, with recommendations to keep patient NPO,  and IV fluids, which patient was to start a regular low fiber diet today, to see if he can tolerate it.  -CT abdomen/pelvis with contrast today shows:  There are 2 cylindrical radiopaque foreign bodies, likely batteries, within a   distended loop of colon in the right mid abdomen, which is immediately proximal   to a right mid abdominal colostomy. The associated loop of colon is distended,   up  to 7 cm. No evidence of perforation.   2. Mild small bowel dilatation in the right lower quadrant, up to 3.9 cm, which   may be due to a surgical adhesion.   3. No evidence of bowel perforation.   Spoke general surgery (Dr. Cherly Anderson) and made him aware that the abdominal series performed today did not demonstrated the foreign bodies, he states that as long as the patient is tolerating a diet he is stable for discharge,but will need to follow-up with GI specialist.  -Hx of RLQ Ileostomy since age 3 months.   - decrease dilaudid to 0.37m q3h, change to percocet as soon an surgery lifts npo status, Iv medication changed to oral Percocet today  - surgery to eval this am, they are aware of battery  - ngt was attempted, pt could not tolerate placement        Hypokalemia  -  resolved      Current Facility-Administered Medications   Medication Dose Route Frequency Provider Last Rate Last Admin    HYDROmorphone (DILAUDID) injection 0.5 mg  0.5 mg IntraVENous Q3H PRN AMickie Kay MD   0.5 mg at 08/10/21 0218    pantoprazole (PROTONIX) injection 40 mg  40 mg IntraVENous Daily PGracy Racer MD   40 mg at 08/09/21 0935    diatrizoate meglumine-sodium (GASTROGRAFIN) 66-10 % solution 5 mL  5 mL Oral ONCE PRN ELurline Idol MD   5 mL at 08/07/21 1951    dextrose 5 % and 0.45 % NaCl with KCl 20 mEq infusion   IntraVENous Continuous OHardie Pulley APRN - NP 100 mL/hr at 08/09/21 2246 New Bag at 08/09/21 2246          DIET: Regular low fiber diet    NOTIFY YOUR PHYSICIAN FOR ANY OF THE FOLLOWING:   Fever over 101 degrees for 24 hours.   Chest pain, shortness of breath, fever, chills, nausea, vomiting, diarrhea, change in mentation, falling, weakness, bleeding. Severe pain or pain not relieved by medications.  Or, any other signs or symptoms that you may have questions about.    DISPOSITION:acute care hospital for gi coverage    Home With[facility]   [facility]       Long term SNF/Inpatient Rehab    Independent/assisted living    Hospice    Other:   PHYSICAL EXAMINATION AT DISCHARGE:  General:          Alert, cooperative, no distress, appears stated age.     HEENT:           Atraumatic, anicteric sclerae, pink conjunctivae                          No oral ulcers, mucosa moist, throat clear, dentition fair  Neck:               Supple, symmetrical  Lungs:             Clear to auscultation bilaterally.  No Wheezing or Rhonchi. No rales.  Chest wall:      No tenderness  No Accessory muscle use.  Heart:              Regular  rhythm,  No  murmur   No edema  Abdomen:        Soft, +-tender. Not distended.  Bowel sounds normal  Extremities:     No cyanosis.  No clubbing,  Skin turgor normal, Capillary refill normal  Skin:                Not pale.  Not Jaundiced  No rashes    Psych:             Not anxious or agitated.  Neurologic:      Alert, moves all extremities, answers questions appropriately and responds to commands       CHRONIC MEDICAL DIAGNOSES:      Greater than 45 minutes were spent with the patient on counseling and coordination of care    Signed:   Warnell Forester, ACNP  08/10/2021  11:57AM

## 2021-08-11 ENCOUNTER — Inpatient Hospital Stay
Admit: 2021-08-11 | Discharge: 2021-08-11 | Disposition: A | Discharge status: Home / Self Care | Payer: PRIVATE HEALTH INSURANCE | Attending: Emergency Medicine

## 2021-08-11 DIAGNOSIS — T85848A Pain due to other internal prosthetic devices, implants and grafts, initial encounter: Secondary | ICD-10-CM

## 2021-08-11 DIAGNOSIS — Z433 Encounter for attention to colostomy: Secondary | ICD-10-CM

## 2021-08-11 LAB — C.TRACHOMATIS N.GONORRHOEAE DNA
Chlamydia trachomatis, NAA: NEGATIVE
Neisseria Gonorrhoeae, NAA: NEGATIVE

## 2021-08-11 NOTE — ED Provider Notes (Signed)
[facility] EMERGENCY DEPT  EMERGENCY DEPARTMENT ENCOUNTER      Pt Name: Patrick Villegas  MRN: [id number]  Birthdate 11-04-1990  Date of evaluation: 08/11/2021  Provider: Amalia Greenhouse, MD    CHIEF COMPLAINT       Chief Complaint   Patient presents with    GI Problem     Pain and irritation around the stoma site on his abdomen         HISTORY OF PRESENT ILLNESS   (Location/Symptom, Timing/Onset, Context/Setting, Quality, Duration, Modifying Factors, Severity)  Note limiting factors.   Kamdyn Colborn is a 31 y.o. male who presents to the emergency department     31 yo m with  c/o need for colostomy care. Has been using towel around the colostomy opening which has bee irritating his skin. Has skin irritation; not abdominal pain. Colostomy draining well and feels fine otherwise.    The history is provided by the patient.     Nursing Notes were reviewed.    REVIEW OF SYSTEMS    (2-9 systems for level 4, 10 or more for level 5)     Review of Systems   Gastrointestinal:  Negative for abdominal pain, constipation, diarrhea, nausea and vomiting.   Musculoskeletal:  Negative for arthralgias.   Skin:  Negative for rash.   Neurological:  Negative for headaches.   Psychiatric/Behavioral:  Negative for confusion.      Except as noted above the remainder of the review of systems was reviewed and negative.       PAST MEDICAL HISTORY     Past Medical History:   Diagnosis Date    Congenital megacolon     Hirschsprung disease         SURGICAL HISTORY       Past Surgical History:   Procedure Laterality Date    ABDOMEN SURGERY      COLOSTOMY           CURRENT MEDICATIONS       Discharge Medication List as of 08/11/2021  3:17 PM        CONTINUE these medications which have NOT CHANGED    Details   cyanocobalamin 1000 MCG/ML injection Inject 1,000 mcg into the muscle onceHistorical Med             ALLERGIES     Banana and Watermelon flavor    FAMILY HISTORY       Family History   Problem Relation Age of Onset    No Known Problems Mother     No  Known Problems Father     No Known Problems Sister     No Known Problems Brother     No Known Problems Maternal Aunt     No Known Problems Maternal Uncle     No Known Problems Paternal Aunt     No Known Problems Paternal Uncle     No Known Problems Maternal Grandmother     No Known Problems Maternal Grandfather     No Known Problems Paternal Grandmother     No Known Problems Paternal Grandfather     No Known Problems Maternal Cousin     No Known Problems Paternal Cousin     No Known Problems Other           SOCIAL HISTORY       Social History     Socioeconomic History    Marital status: Single     Spouse name: None    Number of children: None  Years of education: None    Highest education level: None   Tobacco Use    Smoking status: Never     Passive exposure: Never    Smokeless tobacco: Never   Vaping Use    Vaping Use: Never used   Substance and Sexual Activity    Alcohol use: Not Currently    Drug use: Never    Sexual activity: Defer     Partners: Male       SCREENINGS                               CIWA Assessment  BP: 130/71  Heart Rate: 86                 PHYSICAL EXAM    (up to 7 for level 4, 8 or more for level 5)     ED Triage Vitals [08/11/21 1410]   BP Temp Temp src Heart Rate Resp SpO2 Height Weight   130/71 98.1 ??F (36.7 ??C) -- 86 16 98 % 5\' 5"  (1.651 m) 154 lb (69.9 kg)       Physical Exam  Vitals and nursing note reviewed.   Constitutional:       Appearance: Normal appearance. He is not ill-appearing.   Cardiovascular:      Rate and Rhythm: Regular rhythm.      Pulses: Normal pulses.      Heart sounds: Normal heart sounds. No murmur heard.  Pulmonary:      Effort: No respiratory distress.      Breath sounds: Normal breath sounds. No wheezing.   Abdominal:      General: Bowel sounds are normal. There is no distension.      Palpations: Abdomen is soft.      Tenderness: There is no abdominal tenderness.      Comments: Colostomy draining well   Neurological:      Mental Status: He is oriented to person,  place, and time.       DIAGNOSTIC RESULTS     EKG: All EKG's are interpreted by the Emergency Department Physician who either signs or Co-signs this chart in the absence of a cardiologist.        RADIOLOGY:   Non-plain film images such as CT, Ultrasound and MRI are read by the radiologist. Plain radiographic images are visualized and preliminarily interpreted by the emergency physician with the below findings:        Interpretation per the Radiologist below, if available at the time of this note:    No orders to display         ED BEDSIDE ULTRASOUND:   Performed by ED Physician - none    LABS:  Labs Reviewed - No data to display    All other labs were within normal range or not returned as of this dictation.    EMERGENCY DEPARTMENT COURSE and DIFFERENTIAL DIAGNOSIS/MDM:   Vitals:    Vitals:    08/11/21 1410   BP: 130/71   Pulse: 86   Resp: 16   Temp: 98.1 ??F (36.7 ??C)   SpO2: 98%   Weight: 154 lb (69.9 kg)   Height: 5\' 5"  (1.651 m)         Medical Decision Making          REASSESSMENT          CONSULTS:  None    PROCEDURES:  Unless otherwise noted below, none  Procedures      FINAL IMPRESSION      1. Colostomy care [facility])          DISPOSITION/PLAN   DISPOSITION Decision To Discharge 08/11/2021 03:16:26 PM      PATIENT REFERRED TO:  Mikle Bosworth, APRN - NP  [address]  [address]  [place] [address]  [phone]    Schedule an appointment as soon as possible for a visit in 1 week      DISCHARGE MEDICATIONS:  Discharge Medication List as of 08/11/2021  3:17 PM        Controlled Substances Monitoring:     No flowsheet data found.    (Please note that portions of this note were completed with a voice recognition program.  Efforts were made to edit the dictations but occasionally words are mis-transcribed.)    Amalia Greenhouse, MD (electronically signed)  Attending Emergency Physician    .g     Amalia Greenhouse, MD  08/11/21 [phone]

## 2021-08-11 NOTE — ED Triage Notes (Signed)
Has pain around the ostomy site. Ran out of colostomy supplies and has been using towels
